# Patient Record
Sex: Female | Born: 1940 | Race: White | Hispanic: No | Smoking: Never smoker
Health system: Southern US, Community
[De-identification: ages and names within clinical notes are randomized; demographics above are authoritative.]

## PROBLEM LIST (undated history)

## (undated) DIAGNOSIS — M1990 Unspecified osteoarthritis, unspecified site: Secondary | ICD-10-CM

## (undated) DIAGNOSIS — E559 Vitamin D deficiency, unspecified: Secondary | ICD-10-CM

## (undated) DIAGNOSIS — E039 Hypothyroidism, unspecified: Secondary | ICD-10-CM

## (undated) DIAGNOSIS — E43 Unspecified severe protein-calorie malnutrition: Secondary | ICD-10-CM

## (undated) DIAGNOSIS — I10 Essential (primary) hypertension: Secondary | ICD-10-CM

## (undated) DIAGNOSIS — I499 Cardiac arrhythmia, unspecified: Secondary | ICD-10-CM

## (undated) DIAGNOSIS — Z950 Presence of cardiac pacemaker: Secondary | ICD-10-CM

## (undated) DIAGNOSIS — E876 Hypokalemia: Secondary | ICD-10-CM

## (undated) DIAGNOSIS — K589 Irritable bowel syndrome without diarrhea: Secondary | ICD-10-CM

## (undated) HISTORY — PX: ABDOMINAL HYSTERECTOMY: SHX81

## (undated) HISTORY — PX: OTHER SURGICAL HISTORY: SHX169

## (undated) HISTORY — PX: TOTAL KNEE ARTHROPLASTY: SHX125

## (undated) HISTORY — PX: INSERT / REPLACE / REMOVE PACEMAKER: SUR710

## (undated) HISTORY — PX: FRACTURE SURGERY: SHX138

---

## 1998-09-30 ENCOUNTER — Ambulatory Visit (HOSPITAL_COMMUNITY): Admission: RE | Admit: 1998-09-30 | Discharge: 1998-09-30 | Payer: Self-pay | Admitting: Gastroenterology

## 1999-08-28 ENCOUNTER — Other Ambulatory Visit: Admission: RE | Admit: 1999-08-28 | Discharge: 1999-08-28 | Payer: Self-pay | Admitting: Gynecology

## 2000-03-28 ENCOUNTER — Encounter: Payer: Self-pay | Admitting: Family Medicine

## 2000-03-28 ENCOUNTER — Ambulatory Visit (HOSPITAL_COMMUNITY): Admission: RE | Admit: 2000-03-28 | Discharge: 2000-03-28 | Payer: Self-pay | Admitting: Family Medicine

## 2000-07-09 ENCOUNTER — Ambulatory Visit (HOSPITAL_COMMUNITY): Admission: RE | Admit: 2000-07-09 | Discharge: 2000-07-09 | Payer: Self-pay | Admitting: Family Medicine

## 2000-07-09 ENCOUNTER — Encounter: Payer: Self-pay | Admitting: Family Medicine

## 2000-07-23 ENCOUNTER — Ambulatory Visit (HOSPITAL_COMMUNITY): Admission: RE | Admit: 2000-07-23 | Discharge: 2000-07-23 | Payer: Self-pay | Admitting: Family Medicine

## 2000-07-23 ENCOUNTER — Encounter: Payer: Self-pay | Admitting: Family Medicine

## 2000-08-09 ENCOUNTER — Ambulatory Visit (HOSPITAL_COMMUNITY): Admission: RE | Admit: 2000-08-09 | Discharge: 2000-08-09 | Payer: Self-pay | Admitting: Family Medicine

## 2000-08-09 ENCOUNTER — Encounter: Payer: Self-pay | Admitting: Family Medicine

## 2001-01-10 ENCOUNTER — Other Ambulatory Visit: Admission: RE | Admit: 2001-01-10 | Discharge: 2001-01-10 | Payer: Self-pay | Admitting: Gynecology

## 2001-02-27 ENCOUNTER — Encounter: Payer: Self-pay | Admitting: Family Medicine

## 2001-02-27 ENCOUNTER — Encounter: Admission: RE | Admit: 2001-02-27 | Discharge: 2001-02-27 | Payer: Self-pay | Admitting: Family Medicine

## 2001-05-02 ENCOUNTER — Encounter: Payer: Self-pay | Admitting: Gynecology

## 2001-05-02 ENCOUNTER — Encounter: Admission: RE | Admit: 2001-05-02 | Discharge: 2001-05-02 | Payer: Self-pay | Admitting: Gynecology

## 2002-02-24 ENCOUNTER — Other Ambulatory Visit: Admission: RE | Admit: 2002-02-24 | Discharge: 2002-02-24 | Payer: Self-pay | Admitting: Gynecology

## 2002-04-09 ENCOUNTER — Encounter: Admission: RE | Admit: 2002-04-09 | Discharge: 2002-04-09 | Payer: Self-pay | Admitting: Gynecology

## 2002-04-09 ENCOUNTER — Encounter: Payer: Self-pay | Admitting: Gynecology

## 2003-03-11 ENCOUNTER — Other Ambulatory Visit: Admission: RE | Admit: 2003-03-11 | Discharge: 2003-03-11 | Payer: Self-pay | Admitting: Gynecology

## 2003-03-16 ENCOUNTER — Encounter: Payer: Self-pay | Admitting: Gynecology

## 2003-03-16 ENCOUNTER — Encounter: Admission: RE | Admit: 2003-03-16 | Discharge: 2003-03-16 | Payer: Self-pay | Admitting: Gynecology

## 2004-05-22 ENCOUNTER — Other Ambulatory Visit: Admission: RE | Admit: 2004-05-22 | Discharge: 2004-05-22 | Payer: Self-pay | Admitting: Gynecology

## 2004-05-30 ENCOUNTER — Encounter: Admission: RE | Admit: 2004-05-30 | Discharge: 2004-05-30 | Payer: Self-pay | Admitting: Gynecology

## 2005-08-22 ENCOUNTER — Other Ambulatory Visit: Admission: RE | Admit: 2005-08-22 | Discharge: 2005-08-22 | Payer: Self-pay | Admitting: Gynecology

## 2005-09-04 ENCOUNTER — Encounter: Admission: RE | Admit: 2005-09-04 | Discharge: 2005-09-04 | Payer: Self-pay | Admitting: Gynecology

## 2006-08-27 ENCOUNTER — Other Ambulatory Visit: Admission: RE | Admit: 2006-08-27 | Discharge: 2006-08-27 | Payer: Self-pay | Admitting: Gynecology

## 2006-09-10 ENCOUNTER — Encounter: Admission: RE | Admit: 2006-09-10 | Discharge: 2006-09-10 | Payer: Self-pay | Admitting: Gynecology

## 2006-10-01 ENCOUNTER — Encounter: Admission: RE | Admit: 2006-10-01 | Discharge: 2006-10-01 | Payer: Self-pay | Admitting: Gynecology

## 2015-04-29 ENCOUNTER — Emergency Department (HOSPITAL_COMMUNITY): Payer: No Typology Code available for payment source

## 2015-04-29 ENCOUNTER — Inpatient Hospital Stay (HOSPITAL_COMMUNITY): Payer: No Typology Code available for payment source | Admitting: Certified Registered Nurse Anesthetist

## 2015-04-29 ENCOUNTER — Other Ambulatory Visit: Payer: Self-pay

## 2015-04-29 ENCOUNTER — Encounter (HOSPITAL_COMMUNITY): Admission: EM | Disposition: A | Discharge status: Rehabilitation Facility | Payer: Self-pay | Source: Home / Self Care

## 2015-04-29 ENCOUNTER — Inpatient Hospital Stay (HOSPITAL_COMMUNITY): Payer: No Typology Code available for payment source

## 2015-04-29 ENCOUNTER — Inpatient Hospital Stay (HOSPITAL_COMMUNITY)
Admission: EM | Admit: 2015-04-29 | Discharge: 2015-05-06 | DRG: 480 | Disposition: A | Discharge status: Rehabilitation Facility | Payer: No Typology Code available for payment source | Attending: General Surgery | Admitting: General Surgery

## 2015-04-29 ENCOUNTER — Encounter (HOSPITAL_COMMUNITY): Payer: Self-pay | Admitting: Cardiology

## 2015-04-29 DIAGNOSIS — I442 Atrioventricular block, complete: Secondary | ICD-10-CM | POA: Diagnosis not present

## 2015-04-29 DIAGNOSIS — E559 Vitamin D deficiency, unspecified: Secondary | ICD-10-CM | POA: Diagnosis present

## 2015-04-29 DIAGNOSIS — D62 Acute posthemorrhagic anemia: Secondary | ICD-10-CM | POA: Diagnosis present

## 2015-04-29 DIAGNOSIS — Z91040 Latex allergy status: Secondary | ICD-10-CM

## 2015-04-29 DIAGNOSIS — I440 Atrioventricular block, first degree: Secondary | ICD-10-CM | POA: Diagnosis not present

## 2015-04-29 DIAGNOSIS — M9711XS 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, sequela: Secondary | ICD-10-CM | POA: Diagnosis not present

## 2015-04-29 DIAGNOSIS — R58 Hemorrhage, not elsewhere classified: Secondary | ICD-10-CM

## 2015-04-29 DIAGNOSIS — S2243XA Multiple fractures of ribs, bilateral, initial encounter for closed fracture: Secondary | ICD-10-CM | POA: Diagnosis present

## 2015-04-29 DIAGNOSIS — Z888 Allergy status to other drugs, medicaments and biological substances status: Secondary | ICD-10-CM

## 2015-04-29 DIAGNOSIS — R079 Chest pain, unspecified: Secondary | ICD-10-CM | POA: Diagnosis not present

## 2015-04-29 DIAGNOSIS — M21379 Foot drop, unspecified foot: Secondary | ICD-10-CM | POA: Diagnosis present

## 2015-04-29 DIAGNOSIS — N390 Urinary tract infection, site not specified: Secondary | ICD-10-CM | POA: Diagnosis not present

## 2015-04-29 DIAGNOSIS — R571 Hypovolemic shock: Secondary | ICD-10-CM | POA: Diagnosis present

## 2015-04-29 DIAGNOSIS — R001 Bradycardia, unspecified: Secondary | ICD-10-CM | POA: Diagnosis not present

## 2015-04-29 DIAGNOSIS — J939 Pneumothorax, unspecified: Secondary | ICD-10-CM | POA: Diagnosis not present

## 2015-04-29 DIAGNOSIS — R52 Pain, unspecified: Secondary | ICD-10-CM | POA: Diagnosis not present

## 2015-04-29 DIAGNOSIS — S2220XS Unspecified fracture of sternum, sequela: Secondary | ICD-10-CM | POA: Diagnosis not present

## 2015-04-29 DIAGNOSIS — E039 Hypothyroidism, unspecified: Secondary | ICD-10-CM | POA: Diagnosis present

## 2015-04-29 DIAGNOSIS — I10 Essential (primary) hypertension: Secondary | ICD-10-CM | POA: Diagnosis present

## 2015-04-29 DIAGNOSIS — J9601 Acute respiratory failure with hypoxia: Secondary | ICD-10-CM | POA: Diagnosis not present

## 2015-04-29 DIAGNOSIS — R9431 Abnormal electrocardiogram [ECG] [EKG]: Secondary | ICD-10-CM | POA: Diagnosis present

## 2015-04-29 DIAGNOSIS — S72401A Unspecified fracture of lower end of right femur, initial encounter for closed fracture: Secondary | ICD-10-CM | POA: Diagnosis present

## 2015-04-29 DIAGNOSIS — Z419 Encounter for procedure for purposes other than remedying health state, unspecified: Secondary | ICD-10-CM

## 2015-04-29 DIAGNOSIS — R778 Other specified abnormalities of plasma proteins: Secondary | ICD-10-CM | POA: Diagnosis present

## 2015-04-29 DIAGNOSIS — S36112A Contusion of liver, initial encounter: Secondary | ICD-10-CM | POA: Diagnosis present

## 2015-04-29 DIAGNOSIS — S270XXS Traumatic pneumothorax, sequela: Secondary | ICD-10-CM | POA: Diagnosis not present

## 2015-04-29 DIAGNOSIS — R0902 Hypoxemia: Secondary | ICD-10-CM | POA: Diagnosis not present

## 2015-04-29 DIAGNOSIS — S2220XA Unspecified fracture of sternum, initial encounter for closed fracture: Secondary | ICD-10-CM | POA: Diagnosis present

## 2015-04-29 DIAGNOSIS — I495 Sick sinus syndrome: Secondary | ICD-10-CM | POA: Diagnosis not present

## 2015-04-29 DIAGNOSIS — S2243XS Multiple fractures of ribs, bilateral, sequela: Secondary | ICD-10-CM | POA: Diagnosis not present

## 2015-04-29 DIAGNOSIS — Z938 Other artificial opening status: Secondary | ICD-10-CM | POA: Diagnosis not present

## 2015-04-29 DIAGNOSIS — Z91018 Allergy to other foods: Secondary | ICD-10-CM | POA: Diagnosis not present

## 2015-04-29 DIAGNOSIS — Z885 Allergy status to narcotic agent status: Secondary | ICD-10-CM | POA: Diagnosis not present

## 2015-04-29 DIAGNOSIS — S72309A Unspecified fracture of shaft of unspecified femur, initial encounter for closed fracture: Secondary | ICD-10-CM

## 2015-04-29 DIAGNOSIS — M9711XA 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, initial encounter: Secondary | ICD-10-CM | POA: Diagnosis present

## 2015-04-29 DIAGNOSIS — R7989 Other specified abnormal findings of blood chemistry: Secondary | ICD-10-CM | POA: Diagnosis not present

## 2015-04-29 DIAGNOSIS — J9811 Atelectasis: Secondary | ICD-10-CM | POA: Diagnosis not present

## 2015-04-29 DIAGNOSIS — R55 Syncope and collapse: Secondary | ICD-10-CM

## 2015-04-29 DIAGNOSIS — M810 Age-related osteoporosis without current pathological fracture: Secondary | ICD-10-CM | POA: Diagnosis present

## 2015-04-29 DIAGNOSIS — S270XXA Traumatic pneumothorax, initial encounter: Secondary | ICD-10-CM | POA: Diagnosis present

## 2015-04-29 DIAGNOSIS — Z9889 Other specified postprocedural states: Secondary | ICD-10-CM

## 2015-04-29 DIAGNOSIS — E876 Hypokalemia: Secondary | ICD-10-CM | POA: Diagnosis present

## 2015-04-29 DIAGNOSIS — I4581 Long QT syndrome: Secondary | ICD-10-CM | POA: Diagnosis not present

## 2015-04-29 DIAGNOSIS — R339 Retention of urine, unspecified: Secondary | ICD-10-CM | POA: Diagnosis not present

## 2015-04-29 DIAGNOSIS — J9600 Acute respiratory failure, unspecified whether with hypoxia or hypercapnia: Secondary | ICD-10-CM | POA: Diagnosis not present

## 2015-04-29 DIAGNOSIS — S72491A Other fracture of lower end of right femur, initial encounter for closed fracture: Secondary | ICD-10-CM | POA: Diagnosis not present

## 2015-04-29 DIAGNOSIS — R0782 Intercostal pain: Secondary | ICD-10-CM | POA: Diagnosis not present

## 2015-04-29 DIAGNOSIS — S272XXA Traumatic hemopneumothorax, initial encounter: Secondary | ICD-10-CM | POA: Diagnosis present

## 2015-04-29 DIAGNOSIS — T148XXA Other injury of unspecified body region, initial encounter: Secondary | ICD-10-CM

## 2015-04-29 HISTORY — DX: Hypothyroidism, unspecified: E03.9

## 2015-04-29 HISTORY — DX: Unspecified osteoarthritis, unspecified site: M19.90

## 2015-04-29 HISTORY — DX: Irritable bowel syndrome, unspecified: K58.9

## 2015-04-29 HISTORY — DX: Vitamin D deficiency, unspecified: E55.9

## 2015-04-29 HISTORY — PX: ORIF FEMUR FRACTURE: SHX2119

## 2015-04-29 HISTORY — DX: Essential (primary) hypertension: I10

## 2015-04-29 LAB — PREPARE FRESH FROZEN PLASMA
UNIT DIVISION: 0
Unit division: 0

## 2015-04-29 LAB — POCT I-STAT 3, ART BLOOD GAS (G3+)
Acid-base deficit: 5 mmol/L — ABNORMAL HIGH (ref 0.0–2.0)
BICARBONATE: 20.6 meq/L (ref 20.0–24.0)
O2 SAT: 99 %
TCO2: 22 mmol/L (ref 0–100)
pCO2 arterial: 40.9 mmHg (ref 35.0–45.0)
pH, Arterial: 7.309 — ABNORMAL LOW (ref 7.350–7.450)
pO2, Arterial: 153 mmHg — ABNORMAL HIGH (ref 80.0–100.0)

## 2015-04-29 LAB — I-STAT CHEM 8, ED
BUN: 25 mg/dL — ABNORMAL HIGH (ref 6–20)
BUN: 27 mg/dL — AB (ref 6–20)
CALCIUM ION: 1.02 mmol/L — AB (ref 1.13–1.30)
CREATININE: 0.9 mg/dL (ref 0.44–1.00)
Calcium, Ion: 1.08 mmol/L — ABNORMAL LOW (ref 1.13–1.30)
Chloride: 108 mmol/L (ref 101–111)
Chloride: 112 mmol/L — ABNORMAL HIGH (ref 101–111)
Creatinine, Ser: 1.2 mg/dL — ABNORMAL HIGH (ref 0.44–1.00)
GLUCOSE: 154 mg/dL — AB (ref 65–99)
GLUCOSE: 113 mg/dL — AB (ref 65–99)
HCT: 30 % — ABNORMAL LOW (ref 36.0–46.0)
HCT: 32 % — ABNORMAL LOW (ref 36.0–46.0)
HEMOGLOBIN: 10.2 g/dL — AB (ref 12.0–15.0)
Hemoglobin: 10.9 g/dL — ABNORMAL LOW (ref 12.0–15.0)
POTASSIUM: 3.2 mmol/L — AB (ref 3.5–5.1)
Potassium: 3.3 mmol/L — ABNORMAL LOW (ref 3.5–5.1)
Sodium: 144 mmol/L (ref 135–145)
Sodium: 142 mmol/L (ref 135–145)
TCO2: 21 mmol/L (ref 0–100)
TCO2: 20 mmol/L (ref 0–100)

## 2015-04-29 LAB — COMPREHENSIVE METABOLIC PANEL
ALT: 278 U/L — ABNORMAL HIGH (ref 14–54)
ANION GAP: 6 (ref 5–15)
AST: 335 U/L — ABNORMAL HIGH (ref 15–41)
Albumin: 3 g/dL — ABNORMAL LOW (ref 3.5–5.0)
Alkaline Phosphatase: 71 U/L (ref 38–126)
BILIRUBIN TOTAL: 0.9 mg/dL (ref 0.3–1.2)
BUN: 24 mg/dL — AB (ref 6–20)
CO2: 22 mmol/L (ref 22–32)
Calcium: 7.9 mg/dL — ABNORMAL LOW (ref 8.9–10.3)
Chloride: 114 mmol/L — ABNORMAL HIGH (ref 101–111)
Creatinine, Ser: 1.18 mg/dL — ABNORMAL HIGH (ref 0.44–1.00)
GFR calc Af Amer: 51 mL/min — ABNORMAL LOW (ref >60)
GFR, EST NON AFRICAN AMERICAN: 44 mL/min — AB (ref >60)
Glucose, Bld: 162 mg/dL — ABNORMAL HIGH (ref 65–99)
POTASSIUM: 3.2 mmol/L — AB (ref 3.5–5.1)
Sodium: 142 mmol/L (ref 135–145)
TOTAL PROTEIN: 4.9 g/dL — AB (ref 6.5–8.1)

## 2015-04-29 LAB — CBC
HEMATOCRIT: 29.6 % — AB (ref 36.0–46.0)
HEMOGLOBIN: 9.5 g/dL — AB (ref 12.0–15.0)
MCH: 27.9 pg (ref 26.0–34.0)
MCHC: 32.1 g/dL (ref 30.0–36.0)
MCV: 86.8 fL (ref 78.0–100.0)
Platelets: 192 10*3/uL (ref 150–400)
RBC: 3.41 MIL/uL — ABNORMAL LOW (ref 3.87–5.11)
RDW: 14.9 % (ref 11.5–15.5)
WBC: 10.6 10*3/uL — AB (ref 4.0–10.5)

## 2015-04-29 LAB — TRIGLYCERIDES: Triglycerides: 22 mg/dL (ref <150)

## 2015-04-29 LAB — BLOOD PRODUCT ORDER (VERBAL) VERIFICATION

## 2015-04-29 LAB — MRSA PCR SCREENING: MRSA by PCR: NEGATIVE

## 2015-04-29 LAB — I-STAT TROPONIN, ED: TROPONIN I, POC: 0.66 ng/mL — AB (ref 0.00–0.08)

## 2015-04-29 LAB — PROTIME-INR
INR: 1.22 (ref 0.00–1.49)
PROTHROMBIN TIME: 15.6 s — AB (ref 11.6–15.2)

## 2015-04-29 LAB — ETHANOL

## 2015-04-29 LAB — I-STAT CG4 LACTIC ACID, ED: LACTIC ACID, VENOUS: 2.04 mmol/L — AB (ref 0.5–2.0)

## 2015-04-29 LAB — ABO/RH: ABO/RH(D): A POS

## 2015-04-29 LAB — CDS SEROLOGY

## 2015-04-29 LAB — LIPASE, BLOOD: Lipase: 39 U/L (ref 11–51)

## 2015-04-29 SURGERY — OPEN REDUCTION INTERNAL FIXATION (ORIF) DISTAL FEMUR FRACTURE
Anesthesia: General | Laterality: Right

## 2015-04-29 MED ORDER — MIDAZOLAM HCL 5 MG/5ML IJ SOLN
INTRAMUSCULAR | Status: DC | PRN
  Administered 2015-04-29: 1 mg via INTRAVENOUS
  Administered 2015-04-29 (×2): .5 mg via INTRAVENOUS

## 2015-04-29 MED ORDER — PHENYLEPHRINE 40 MCG/ML (10ML) SYRINGE FOR IV PUSH (FOR BLOOD PRESSURE SUPPORT)
PREFILLED_SYRINGE | INTRAVENOUS | Status: AC
  Filled 2015-04-29: qty 30

## 2015-04-29 MED ORDER — PHENYLEPHRINE HCL 10 MG/ML IJ SOLN
10.0000 mg | INTRAVENOUS | Status: DC | PRN
  Administered 2015-04-29: 50 ug/min via INTRAVENOUS

## 2015-04-29 MED ORDER — OXYCODONE HCL 5 MG PO TABS: 5.0000 mg | ORAL_TABLET | Freq: Once | ORAL | Status: DC | PRN

## 2015-04-29 MED ORDER — PANTOPRAZOLE SODIUM 40 MG PO TBEC
40.0000 mg | DELAYED_RELEASE_TABLET | Freq: Every day | ORAL | Status: DC
  Administered 2015-04-30 – 2015-05-05 (×6): 40 mg via ORAL
  Filled 2015-04-29 (×6): qty 1

## 2015-04-29 MED ORDER — DIPHENHYDRAMINE HCL 50 MG/ML IJ SOLN: 12.5000 mg | Freq: Four times a day (QID) | INTRAMUSCULAR | Status: DC | PRN

## 2015-04-29 MED ORDER — ETOMIDATE 2 MG/ML IV SOLN
INTRAVENOUS | Status: AC
  Filled 2015-04-29: qty 20

## 2015-04-29 MED ORDER — OXYCODONE HCL 5 MG/5ML PO SOLN: 5.0000 mg | Freq: Once | ORAL | Status: DC | PRN

## 2015-04-29 MED ORDER — PANTOPRAZOLE SODIUM 40 MG IV SOLR
40.0000 mg | Freq: Every day | INTRAVENOUS | Status: DC
  Administered 2015-04-29: 40 mg via INTRAVENOUS
  Filled 2015-04-29 (×5): qty 40

## 2015-04-29 MED ORDER — 0.9 % SODIUM CHLORIDE (POUR BTL) OPTIME
TOPICAL | Status: DC | PRN
  Administered 2015-04-29: 1000 mL

## 2015-04-29 MED ORDER — LIDOCAINE HCL (PF) 1 % IJ SOLN
INTRAMUSCULAR | Status: AC
  Filled 2015-04-29: qty 15

## 2015-04-29 MED ORDER — EPINEPHRINE HCL 0.1 MG/ML IJ SOSY
PREFILLED_SYRINGE | INTRAMUSCULAR | Status: DC | PRN
  Administered 2015-04-29: 0.1 mg via INTRAVENOUS

## 2015-04-29 MED ORDER — ROPIVACAINE HCL 2 MG/ML IJ SOLN
INTRAMUSCULAR | Status: DC | PRN
  Administered 2015-04-29: 3 mL/h via EPIDURAL

## 2015-04-29 MED ORDER — PROPOFOL 500 MG/50ML IV EMUL
INTRAVENOUS | Status: DC | PRN
  Administered 2015-04-29: 50 ug/kg/min via INTRAVENOUS

## 2015-04-29 MED ORDER — SODIUM CHLORIDE 0.9 % IJ SOLN
INTRAMUSCULAR | Status: AC
  Filled 2015-04-29: qty 20

## 2015-04-29 MED ORDER — ANTISEPTIC ORAL RINSE SOLUTION (CORINZ)
7.0000 mL | OROMUCOSAL | Status: DC
  Administered 2015-04-29 – 2015-04-30 (×8): 7 mL via OROMUCOSAL

## 2015-04-29 MED ORDER — SODIUM CHLORIDE 0.9 % IV BOLUS (SEPSIS)
2000.0000 mL | Freq: Once | INTRAVENOUS | Status: AC
  Administered 2015-04-29: 2000 mL via INTRAVENOUS

## 2015-04-29 MED ORDER — ROPIVACAINE HCL 2 MG/ML IJ SOLN
3.0000 mL/h | INTRAMUSCULAR | Status: DC
  Administered 2015-04-29: 3 mL/h via EPIDURAL
  Filled 2015-04-29: qty 200

## 2015-04-29 MED ORDER — POTASSIUM CHLORIDE IN NACL 40-0.9 MEQ/L-% IV SOLN
INTRAVENOUS | Status: DC
  Administered 2015-04-29 – 2015-05-01 (×3): 75 mL/h via INTRAVENOUS
  Administered 2015-05-01: 50 mL/h via INTRAVENOUS
  Filled 2015-04-29 (×8): qty 1000

## 2015-04-29 MED ORDER — ROPIVACAINE HCL 2 MG/ML IJ SOLN
2.0000 mL/h | INTRAMUSCULAR | Status: DC
  Filled 2015-04-29: qty 200

## 2015-04-29 MED ORDER — MIDAZOLAM HCL 2 MG/2ML IJ SOLN
INTRAMUSCULAR | Status: AC
  Filled 2015-04-29: qty 4

## 2015-04-29 MED ORDER — ROCURONIUM BROMIDE 50 MG/5ML IV SOLN
INTRAVENOUS | Status: AC
  Filled 2015-04-29: qty 2

## 2015-04-29 MED ORDER — LACTATED RINGERS IV SOLN
INTRAVENOUS | Status: DC
  Administered 2015-04-29 (×2): via INTRAVENOUS

## 2015-04-29 MED ORDER — SUCCINYLCHOLINE CHLORIDE 20 MG/ML IJ SOLN
INTRAMUSCULAR | Status: DC | PRN
  Administered 2015-04-29: 120 mg via INTRAVENOUS

## 2015-04-29 MED ORDER — PHENYLEPHRINE HCL 10 MG/ML IJ SOLN
INTRAMUSCULAR | Status: DC | PRN
  Administered 2015-04-29 (×3): 240 ug via INTRAVENOUS
  Administered 2015-04-29 (×2): 120 ug via INTRAVENOUS
  Administered 2015-04-29: 40 ug via INTRAVENOUS
  Administered 2015-04-29: 240 ug via INTRAVENOUS

## 2015-04-29 MED ORDER — EPHEDRINE SULFATE 50 MG/ML IJ SOLN
INTRAMUSCULAR | Status: AC
  Filled 2015-04-29: qty 2

## 2015-04-29 MED ORDER — PROPOFOL 1000 MG/100ML IV EMUL
5.0000 ug/kg/min | INTRAVENOUS | Status: DC
  Administered 2015-04-29: 15 ug/kg/min via INTRAVENOUS
  Administered 2015-04-29: 30 ug/kg/min via INTRAVENOUS
  Filled 2015-04-29: qty 100

## 2015-04-29 MED ORDER — MORPHINE SULFATE 2 MG/ML IV SOLN: INTRAVENOUS | Status: DC

## 2015-04-29 MED ORDER — LIDOCAINE HCL (CARDIAC) 20 MG/ML IV SOLN
INTRAVENOUS | Status: AC
  Filled 2015-04-29: qty 15

## 2015-04-29 MED ORDER — DEXAMETHASONE SODIUM PHOSPHATE 4 MG/ML IJ SOLN
INTRAMUSCULAR | Status: AC
  Filled 2015-04-29: qty 2

## 2015-04-29 MED ORDER — ONDANSETRON HCL 4 MG/2ML IJ SOLN
4.0000 mg | Freq: Once | INTRAMUSCULAR | Status: AC
  Administered 2015-04-29: 4 mg via INTRAVENOUS

## 2015-04-29 MED ORDER — NEOSTIGMINE METHYLSULFATE 10 MG/10ML IV SOLN
INTRAVENOUS | Status: AC
  Filled 2015-04-29: qty 1

## 2015-04-29 MED ORDER — HYDROMORPHONE HCL 1 MG/ML IJ SOLN: 0.2500 mg | INTRAMUSCULAR | Status: DC | PRN

## 2015-04-29 MED ORDER — ACETAMINOPHEN 160 MG/5ML PO SOLN
325.0000 mg | ORAL | Status: DC | PRN
  Filled 2015-04-29: qty 20.3

## 2015-04-29 MED ORDER — SODIUM CHLORIDE 0.9 % IJ SOLN: 9.0000 mL | INTRAMUSCULAR | Status: DC | PRN

## 2015-04-29 MED ORDER — EPHEDRINE SULFATE 50 MG/ML IJ SOLN
INTRAMUSCULAR | Status: DC | PRN
  Administered 2015-04-29 (×3): 10 mg via INTRAVENOUS
  Administered 2015-04-29 (×2): 5 mg via INTRAVENOUS
  Administered 2015-04-29: 10 mg via INTRAVENOUS

## 2015-04-29 MED ORDER — ONDANSETRON HCL 4 MG/2ML IJ SOLN
4.0000 mg | Freq: Four times a day (QID) | INTRAMUSCULAR | Status: DC | PRN
  Administered 2015-05-02 – 2015-05-06 (×5): 4 mg via INTRAVENOUS
  Filled 2015-04-29 (×5): qty 2

## 2015-04-29 MED ORDER — LACTATED RINGERS IV SOLN: INTRAVENOUS | Status: DC | PRN

## 2015-04-29 MED ORDER — ENOXAPARIN SODIUM 30 MG/0.3ML ~~LOC~~ SOLN
30.0000 mg | Freq: Two times a day (BID) | SUBCUTANEOUS | Status: DC
Start: 2015-04-29 — End: 2015-04-30
  Administered 2015-04-30 (×2): 30 mg via SUBCUTANEOUS
  Filled 2015-04-29 (×2): qty 0.3

## 2015-04-29 MED ORDER — DIPHENHYDRAMINE HCL 12.5 MG/5ML PO ELIX: 12.5000 mg | ORAL_SOLUTION | Freq: Four times a day (QID) | ORAL | Status: DC | PRN

## 2015-04-29 MED ORDER — FENTANYL CITRATE (PF) 100 MCG/2ML IJ SOLN
INTRAMUSCULAR | Status: DC | PRN
Start: 2015-04-29 — End: 2015-04-29
  Administered 2015-04-29: 50 ug via INTRAVENOUS

## 2015-04-29 MED ORDER — PROMETHAZINE HCL 25 MG/ML IJ SOLN
25.0000 mg | INTRAMUSCULAR | Status: AC
  Administered 2015-04-29: 12.5 mg via INTRAVENOUS
  Filled 2015-04-29: qty 1

## 2015-04-29 MED ORDER — SUCCINYLCHOLINE CHLORIDE 20 MG/ML IJ SOLN
INTRAMUSCULAR | Status: AC
  Filled 2015-04-29: qty 1

## 2015-04-29 MED ORDER — PROPOFOL 10 MG/ML IV BOLUS
INTRAVENOUS | Status: DC | PRN
  Administered 2015-04-29: 30 mg via INTRAVENOUS
  Administered 2015-04-29: 10 mg via INTRAVENOUS

## 2015-04-29 MED ORDER — FENTANYL CITRATE (PF) 250 MCG/5ML IJ SOLN
INTRAMUSCULAR | Status: AC
  Filled 2015-04-29: qty 5

## 2015-04-29 MED ORDER — ALBUMIN HUMAN 5 % IV SOLN
INTRAVENOUS | Status: AC
  Filled 2015-04-29: qty 250

## 2015-04-29 MED ORDER — CHLORHEXIDINE GLUCONATE 0.12% ORAL RINSE (MEDLINE KIT)
15.0000 mL | Freq: Two times a day (BID) | OROMUCOSAL | Status: DC
  Administered 2015-04-29 – 2015-05-01 (×3): 15 mL via OROMUCOSAL

## 2015-04-29 MED ORDER — IOHEXOL 300 MG/ML  SOLN
100.0000 mL | Freq: Once | INTRAMUSCULAR | Status: AC | PRN
  Administered 2015-04-29: 100 mL via INTRAVENOUS

## 2015-04-29 MED ORDER — BUPIVACAINE-EPINEPHRINE (PF) 0.25% -1:200000 IJ SOLN
INTRAMUSCULAR | Status: AC
  Filled 2015-04-29: qty 30

## 2015-04-29 MED ORDER — PHENYLEPHRINE 40 MCG/ML (10ML) SYRINGE FOR IV PUSH (FOR BLOOD PRESSURE SUPPORT)
PREFILLED_SYRINGE | INTRAVENOUS | Status: AC
  Filled 2015-04-29: qty 10

## 2015-04-29 MED ORDER — DEXAMETHASONE SODIUM PHOSPHATE 4 MG/ML IJ SOLN
INTRAMUSCULAR | Status: DC | PRN
  Administered 2015-04-29: 4 mg via INTRAVENOUS

## 2015-04-29 MED ORDER — ONDANSETRON HCL 4 MG/2ML IJ SOLN
INTRAMUSCULAR | Status: AC
  Filled 2015-04-29: qty 8

## 2015-04-29 MED ORDER — NALOXONE HCL 0.4 MG/ML IJ SOLN: 0.4000 mg | INTRAMUSCULAR | Status: DC | PRN

## 2015-04-29 MED ORDER — LIDOCAINE HCL (CARDIAC) 20 MG/ML IV SOLN
INTRAVENOUS | Status: DC | PRN
  Administered 2015-04-29: 20 mg via INTRAVENOUS

## 2015-04-29 MED ORDER — PROPOFOL 10 MG/ML IV BOLUS
INTRAVENOUS | Status: AC
  Filled 2015-04-29: qty 20

## 2015-04-29 MED ORDER — DOCUSATE SODIUM 100 MG PO CAPS
100.0000 mg | ORAL_CAPSULE | Freq: Two times a day (BID) | ORAL | Status: DC
  Administered 2015-04-30 – 2015-05-01 (×2): 100 mg via ORAL
  Filled 2015-04-29 (×4): qty 1

## 2015-04-29 MED ORDER — GLYCOPYRROLATE 0.2 MG/ML IJ SOLN
INTRAMUSCULAR | Status: AC
  Filled 2015-04-29: qty 1

## 2015-04-29 MED ORDER — CEFAZOLIN SODIUM-DEXTROSE 2-3 GM-% IV SOLR
INTRAVENOUS | Status: DC | PRN
  Administered 2015-04-29: 2 g via INTRAVENOUS

## 2015-04-29 MED ORDER — MORPHINE SULFATE (PF) 2 MG/ML IV SOLN
INTRAVENOUS | Status: AC
  Administered 2015-04-29: 2 mg via INTRAVENOUS
  Filled 2015-04-29: qty 1

## 2015-04-29 MED ORDER — ONDANSETRON HCL 4 MG/2ML IJ SOLN
INTRAMUSCULAR | Status: DC | PRN
  Administered 2015-04-29: 4 mg via INTRAVENOUS

## 2015-04-29 MED ORDER — ACETAMINOPHEN 325 MG PO TABS: 325.0000 mg | ORAL_TABLET | ORAL | Status: DC | PRN

## 2015-04-29 MED ORDER — ONDANSETRON HCL 4 MG PO TABS: 4.0000 mg | ORAL_TABLET | Freq: Four times a day (QID) | ORAL | Status: DC | PRN

## 2015-04-29 MED ORDER — LIDOCAINE HCL (CARDIAC) 20 MG/ML IV SOLN
INTRAVENOUS | Status: AC
  Filled 2015-04-29: qty 5

## 2015-04-29 MED ORDER — ALBUMIN HUMAN 5 % IV SOLN
INTRAVENOUS | Status: DC | PRN
Start: 2015-04-29 — End: 2015-04-29
  Administered 2015-04-29 (×4): via INTRAVENOUS

## 2015-04-29 SURGICAL SUPPLY — 39 items
BIT DRILL 200X4XGRY 5XPIN (BIT) ×1 IMPLANT
BIT DRL 200X4XGRY 5XPIN (BIT) ×1
BLADE SURG ROTATE 9660 (MISCELLANEOUS) IMPLANT
BNDG GAUZE ELAST 4 BULKY (GAUZE/BANDAGES/DRESSINGS) ×2 IMPLANT
DRAPE C-ARM 42X72 X-RAY (DRAPES) ×2 IMPLANT
DRAPE C-ARMOR (DRAPES) ×2 IMPLANT
DRAPE IMP U-DRAPE 54X76 (DRAPES) ×4 IMPLANT
DRAPE ORTHO SPLIT 77X108 STRL (DRAPES) ×2
DRAPE SURG ORHT 6 SPLT 77X108 (DRAPES) ×2 IMPLANT
DRAPE U-SHAPE 47X51 STRL (DRAPES) ×2 IMPLANT
DRILL BIT 4.0MM (BIT) ×1
ELECT REM PT RETURN 9FT ADLT (ELECTROSURGICAL) ×2
ELECTRODE REM PT RTRN 9FT ADLT (ELECTROSURGICAL) ×1 IMPLANT
GAUZE XEROFORM 5X9 LF (GAUZE/BANDAGES/DRESSINGS) ×2 IMPLANT
GLOVE NEODERM STRL 7.5 LF PF (GLOVE) ×2 IMPLANT
GLOVE SURG NEODERM 7.5  LF PF (GLOVE) ×2
GOWN STRL REIN XL XLG (GOWN DISPOSABLE) ×2 IMPLANT
KIT BASIN OR (CUSTOM PROCEDURE TRAY) ×2 IMPLANT
KIT ROOM TURNOVER OR (KITS) ×2 IMPLANT
MANIFOLD NEPTUNE II (INSTRUMENTS) ×2 IMPLANT
NS IRRIG 1000ML POUR BTL (IV SOLUTION) ×2 IMPLANT
PACK GENERAL/GYN (CUSTOM PROCEDURE TRAY) ×2 IMPLANT
PACK UNIVERSAL I (CUSTOM PROCEDURE TRAY) ×2 IMPLANT
PAD ARMBOARD 7.5X6 YLW CONV (MISCELLANEOUS) ×2 IMPLANT
PIN APEX 5.0X200MM (PIN) ×4 IMPLANT
PIN APEX 5X180MM EXFIX (EXFIX) ×4 IMPLANT
PIN CLAMP 5H 30DEG POST (EXFIX) ×4 IMPLANT
ROD CARBON (EXFIX) ×2
ROD CNCT 250X11XNS LF HFMN (EXFIX) ×2 IMPLANT
ROD HOFFMANN3 CONNECT 11X350 (EXFIX) ×4 IMPLANT
ROD TO ROD COUPLING EXFIX (EXFIX) ×12 IMPLANT
STAPLER VISISTAT 35W (STAPLE) IMPLANT
SUT ETHILON 2 0 FS 18 (SUTURE) IMPLANT
SUT VIC AB 0 CT1 27 (SUTURE)
SUT VIC AB 0 CT1 27XBRD ANBCTR (SUTURE) IMPLANT
SUT VIC AB 2-0 CT1 36 (SUTURE) ×2 IMPLANT
TOWEL OR 17X24 6PK STRL BLUE (TOWEL DISPOSABLE) ×2 IMPLANT
TOWEL OR 17X26 10 PK STRL BLUE (TOWEL DISPOSABLE) ×4 IMPLANT
WATER STERILE IRR 1000ML POUR (IV SOLUTION) ×4 IMPLANT
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
## 2015-04-29 NOTE — Progress Notes (Signed)
Transported patient from PACU with no complications. RT will continue to monitor.

## 2015-04-29 NOTE — OR Nursing (Signed)
Second procedure added with Dr Tobias Alexander for a thoracic epidural. Met with son in the waiting room to obtain consent.

## 2015-04-29 NOTE — Progress Notes (Signed)
Orthopedic Tech Progress Note Patient Details:  Autumn Benjamin 1940-12-10 [id number]  Ortho Devices Type of Ortho Device: Knee Immobilizer Ortho Device/Splint Location: rle Ortho Device/Splint Interventions: Application   Autumn Benjamin 04/29/2015, 1:42 PM
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
## 2015-04-29 NOTE — Transfer of Care (Signed)
Immediate Anesthesia Transfer of Care Note  Patient: Autumn Benjamin  Procedure(s) Performed: Procedure(s): PLACEMENT OF EXTERNAL FIXATOR FOR DISTAL FEMUR FRACTURE (Right)  Patient Location: PACU  Anesthesia Type:General  Level of Consciousness: Patient remains intubated per anesthesia plan  Airway & Oxygen Therapy: Patient remains intubated per anesthesia plan and Patient placed on Ventilator (see vital sign flow sheet for setting)  Post-op Assessment: Report given to RN and Post -op Vital signs reviewed and stable  Post vital signs: Reviewed and stable  Last Vitals:  Filed Vitals:   04/29/15 1923  BP: 130/73  Pulse: 72  Temp:   Resp: 16    Complications: No apparent anesthesia complications

## 2015-04-29 NOTE — Anesthesia Preprocedure Evaluation (Signed)
Anesthesia Evaluation  Patient identified by MRN, date of birth, ID band Patient awake    Reviewed: Allergy & Precautions, NPO status , Patient's Chart, lab work & pertinent test results  History of Anesthesia Complications Negative for: history of anesthetic complications  Airway Mallampati: III  TM Distance: >3 FB Neck ROM: Full    Dental  (+) Teeth Intact   Pulmonary  Rib fractures with chest tube and short of breath   breath sounds clear to auscultation       Cardiovascular negative cardio ROS   Rhythm:Regular     Neuro/Psych negative neurological ROS  negative psych ROS   GI/Hepatic negative GI ROS, Neg liver ROS,   Endo/Other  Hypothyroidism   Renal/GU negative Renal ROS     Musculoskeletal  (+) Arthritis , Rheumatoid disorders,  Open right femur fracture   Abdominal   Peds  Hematology negative hematology ROS (+)   Anesthesia Other Findings   Reproductive/Obstetrics                             Anesthesia Physical Anesthesia Plan  ASA: III and emergent  Anesthesia Plan: General   Post-op Pain Management:    Induction: Intravenous, Rapid sequence and Cricoid pressure planned  Airway Management Planned: Oral ETT  Additional Equipment: Arterial line  Intra-op Plan:   Post-operative Plan: Extubation in OR and Possible Post-op intubation/ventilation  Informed Consent: I have reviewed the patients History and Physical, chart, labs and discussed the procedure including the risks, benefits and alternatives for the proposed anesthesia with the patient or authorized representative who has indicated his/her understanding and acceptance.   Dental advisory given  Plan Discussed with: CRNA and Surgeon  Anesthesia Plan Comments: (FNB once asleep for post op pain control. )        Anesthesia Quick Evaluation

## 2015-04-29 NOTE — ED Notes (Signed)
Pt back to room. Dr. Rayburn Ma at the bedside to place chest tube

## 2015-04-29 NOTE — ED Notes (Signed)
Autumn Benjamin at the bedside

## 2015-04-29 NOTE — ED Notes (Signed)
Xray at the bedside.

## 2015-04-29 NOTE — OR Nursing (Signed)
Spoke with charge nurse on unit to let them know likelihood of pt returning to unit intubated
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
## 2015-04-29 NOTE — Anesthesia Procedure Notes (Addendum)
Procedure Name: Intubation Date/Time: 04/29/2015 5:21 PM Performed by: Daiva Eves Pre-anesthesia Checklist: Patient identified, Timeout performed, Emergency Drugs available, Suction available and Patient being monitored Patient Re-evaluated:Patient Re-evaluated prior to inductionOxygen Delivery Method: Circle system utilized Preoxygenation: Pre-oxygenation with 100% oxygen Intubation Type: IV induction Ventilation: Mask ventilation without difficulty Laryngoscope Size: Mac and 3 Grade View: Grade I Tube type: Subglottic suction tube Tube size: 7.5 mm Number of attempts: 1 Airway Equipment and Method: Stylet Placement Confirmation: ETT inserted through vocal cords under direct vision,  breath sounds checked- equal and bilateral,  positive ETCO2 and CO2 detector Secured at: 22 cm Tube secured with: Tape Dental Injury: Teeth and Oropharynx as per pre-operative assessment    Epidural Patient location during procedure: OR Start time: 04/29/2015 6:51 PM End time: 04/29/2015 7:03 PM  Staffing Anesthesiologist: Heather Roberts Performed by: anesthesiologist   Preanesthetic Checklist Completed: patient identified, surgical consent, pre-op evaluation, timeout performed, IV checked, risks and benefits discussed, monitors and equipment checked and post-op pain management  Epidural Patient position: left lateral decubitus Prep: DuraPrep Patient monitoring: heart rate, cardiac monitor, continuous pulse ox and blood pressure Approach: midline Location: thoracic (1-12) Injection technique: LOR air  Needle:  Needle type: Tuohy  Needle gauge: 17 G Needle length: 9 cm Needle insertion depth: 5 cm Catheter type: closed end flexible Catheter size: 20 Guage Catheter at skin depth: 10 cm Test dose: negative  Assessment Events: blood not aspirated  Additional Notes Reason for block:post-op pain management
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
## 2015-04-29 NOTE — Anesthesia Postprocedure Evaluation (Signed)
Anesthesia Post Note  Patient: Autumn Benjamin  Procedure(s) Performed: Procedure(s) (LRB): PLACEMENT OF EXTERNAL FIXATOR FOR DISTAL FEMUR FRACTURE (Right)  Anesthesia type: General  Patient location: PACU  Post pain: Pain level controlled  Post assessment: Post-op Vital signs reviewed  Last Vitals:  Filed Vitals:   04/29/15 2053  BP: 136/74  Pulse: 66  Temp: 36.4 C  Resp: 14    Post vital signs: stable  Level of consciousness: Patient remains intubated per anesthesia plan  Complications: No apparent anesthesia complications
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
## 2015-04-29 NOTE — Op Note (Signed)
   Date of Surgery: 04/29/2015  INDICATIONS: Autumn Benjamin is a 74 y.o.-year-old female who sustained a distal femur periprosthetic fracture; she is brought to the operating room with plans of operative fixation of the fracture.  However shortly after induction of anesthesia she became profoundly hypotensive with systolic blood pressures in the 40s and 50s. She required multiple pressors. At that time we decided the best thing for her was not to perform formal fixation rather performed provisional fixation with external fixator device.  PREOPERATIVE DIAGNOSIS: right distal femur periprosthetic fracture   POSTOPERATIVE DIAGNOSIS: Same.  PROCEDURE: External fixation right distal femur fracture CPT 20690  SURGEON: N. Glee Arvin, M.D.  ANESTHESIA: general  IV FLUIDS AND URINE: See anesthesia.  ESTIMATED BLOOD LOSS: minimal mL.  IMPLANTS: [employer]  DRAINS: None.  COMPLICATIONS: None.  DESCRIPTION OF PROCEDURE: The patient was identified in the preoperative holding area.  The operative site was marked by the surgeon and confirmed by the patient.  He was brought back to the operating room.  Anesthesia was induced by the anesthesia team.  A well padded nonsterile tourniquet was placed. The operative extremity was prepped and draped in standard sterile fashion.  A timeout was performed.  Preoperative antibiotics were given.  The bony landmarks were palpated and the pin sites were marked on the skin.  Each Schanz pin was placed in the same fashion -- first drilling with the 3.5 mm drill while copiously irrigating, then hand placing the pin.  This was confirmed on x-ray on both views.  The ex-fix clamps were placed onto pins and the fracture was pulled into the proper alignment.  The clamps were completely tightened.   Final x-rays were taken in AP and lateral views to confirm the reduction and pin lengths. The wounds were cleaned and dried a final time and a sterile dressing consisting of Xeroform and  kerlix was placed.  The patient was then transferred back to the bed and left the operating room in stable condition.  All sponge and instrument counts were correct.  POSTOPERATIVE PLAN: Autumn Benjamin will remain non weight bearing with the leg elevated.  she will return to the operating room for definitive fixation when she is medically optimized.  Autumn Benjamin will receive DVT prophylaxis based on other medications, activity level, and risk ratio of bleeding to thrombosis per primary team.  Pin site care will be initiated on postoperative day one.  Autumn Reel, MD [facility] [phone] 6:12 PM
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
## 2015-04-30 ENCOUNTER — Inpatient Hospital Stay (HOSPITAL_COMMUNITY): Payer: No Typology Code available for payment source

## 2015-04-30 DIAGNOSIS — R55 Syncope and collapse: Secondary | ICD-10-CM

## 2015-04-30 DIAGNOSIS — R7989 Other specified abnormal findings of blood chemistry: Secondary | ICD-10-CM

## 2015-04-30 DIAGNOSIS — I4581 Long QT syndrome: Secondary | ICD-10-CM

## 2015-04-30 DIAGNOSIS — S270XXA Traumatic pneumothorax, initial encounter: Secondary | ICD-10-CM | POA: Diagnosis present

## 2015-04-30 DIAGNOSIS — D62 Acute posthemorrhagic anemia: Secondary | ICD-10-CM | POA: Diagnosis present

## 2015-04-30 DIAGNOSIS — J9600 Acute respiratory failure, unspecified whether with hypoxia or hypercapnia: Secondary | ICD-10-CM | POA: Diagnosis not present

## 2015-04-30 DIAGNOSIS — R9431 Abnormal electrocardiogram [ECG] [EKG]: Secondary | ICD-10-CM | POA: Diagnosis present

## 2015-04-30 DIAGNOSIS — S72401A Unspecified fracture of lower end of right femur, initial encounter for closed fracture: Secondary | ICD-10-CM | POA: Diagnosis present

## 2015-04-30 DIAGNOSIS — R571 Hypovolemic shock: Secondary | ICD-10-CM | POA: Diagnosis present

## 2015-04-30 DIAGNOSIS — R778 Other specified abnormalities of plasma proteins: Secondary | ICD-10-CM | POA: Diagnosis present

## 2015-04-30 DIAGNOSIS — S2243XA Multiple fractures of ribs, bilateral, initial encounter for closed fracture: Secondary | ICD-10-CM | POA: Diagnosis present

## 2015-04-30 LAB — CBC
HCT: 25.2 % — ABNORMAL LOW (ref 36.0–46.0)
HCT: 25.3 % — ABNORMAL LOW (ref 36.0–46.0)
HEMOGLOBIN: 8.1 g/dL — AB (ref 12.0–15.0)
HEMOGLOBIN: 8.2 g/dL — AB (ref 12.0–15.0)
MCH: 27.8 pg (ref 26.0–34.0)
MCH: 27.4 pg (ref 26.0–34.0)
MCHC: 32.5 g/dL (ref 30.0–36.0)
MCHC: 32 g/dL (ref 30.0–36.0)
MCV: 85.4 fL (ref 78.0–100.0)
MCV: 85.5 fL (ref 78.0–100.0)
PLATELETS: 142 10*3/uL — AB (ref 150–400)
PLATELETS: 165 10*3/uL (ref 150–400)
RBC: 2.95 MIL/uL — AB (ref 3.87–5.11)
RBC: 2.96 MIL/uL — ABNORMAL LOW (ref 3.87–5.11)
RDW: 15.1 % (ref 11.5–15.5)
RDW: 15.1 % (ref 11.5–15.5)
WBC: 7.8 10*3/uL (ref 4.0–10.5)
WBC: 8.7 10*3/uL (ref 4.0–10.5)

## 2015-04-30 LAB — BASIC METABOLIC PANEL
Anion gap: 7 (ref 5–15)
BUN: 18 mg/dL (ref 6–20)
CALCIUM: 7.8 mg/dL — AB (ref 8.9–10.3)
CHLORIDE: 116 mmol/L — AB (ref 101–111)
CO2: 23 mmol/L (ref 22–32)
CREATININE: 0.82 mg/dL (ref 0.44–1.00)
GFR calc Af Amer: >60 mL/min (ref >60)
GFR calc non Af Amer: >60 mL/min (ref >60)
Glucose, Bld: 150 mg/dL — ABNORMAL HIGH (ref 65–99)
Potassium: 3.7 mmol/L (ref 3.5–5.1)
SODIUM: 146 mmol/L — AB (ref 135–145)

## 2015-04-30 LAB — PREPARE RBC (CROSSMATCH)

## 2015-04-30 LAB — TROPONIN I: TROPONIN I: 0.52 ng/mL — AB (ref <0.031)

## 2015-04-30 MED ORDER — ROPIVACAINE HCL 2 MG/ML IJ SOLN
6.0000 mL/h | INTRAMUSCULAR | Status: DC
  Administered 2015-05-01 – 2015-05-03 (×2): 6 mL/h via EPIDURAL
  Filled 2015-04-30 (×8): qty 200

## 2015-04-30 MED ORDER — HYDROMORPHONE HCL 1 MG/ML IJ SOLN
1.0000 mg | INTRAMUSCULAR | Status: DC | PRN
Start: 2015-04-30 — End: 2015-05-01
  Administered 2015-04-30: 1 mg via INTRAVENOUS
  Administered 2015-05-01 (×3): 0.5 mg via INTRAVENOUS
  Filled 2015-04-30 (×2): qty 1

## 2015-04-30 MED ORDER — HYDROMORPHONE HCL 1 MG/ML IJ SOLN
1.0000 mg | INTRAMUSCULAR | Status: DC | PRN
  Administered 2015-04-30 (×3): 1 mg via INTRAVENOUS
  Filled 2015-04-30 (×2): qty 1

## 2015-04-30 MED ORDER — LABETALOL HCL 5 MG/ML IV SOLN
10.0000 mg | INTRAVENOUS | Status: DC | PRN
  Administered 2015-04-30 – 2015-05-03 (×2): 10 mg via INTRAVENOUS
  Filled 2015-04-30 (×4): qty 4

## 2015-04-30 MED ORDER — ANTISEPTIC ORAL RINSE SOLUTION (CORINZ)
7.0000 mL | OROMUCOSAL | Status: DC
  Administered 2015-04-30 – 2015-05-01 (×5): 7 mL via OROMUCOSAL

## 2015-04-30 MED ORDER — DEXTROSE 5 % IV SOLN
0.0000 ug/min | INTRAVENOUS | Status: DC
  Administered 2015-04-30: 30 ug/min via INTRAVENOUS
  Filled 2015-04-30 (×3): qty 1

## 2015-04-30 MED ORDER — SODIUM CHLORIDE 0.9 % IV SOLN
1.0000 g | Freq: Once | INTRAVENOUS | Status: AC
  Administered 2015-04-30: 1 g via INTRAVENOUS
  Filled 2015-04-30: qty 10

## 2015-04-30 MED ORDER — HYDROMORPHONE HCL 1 MG/ML IJ SOLN
INTRAMUSCULAR | Status: AC
  Filled 2015-04-30: qty 1

## 2015-04-30 MED ORDER — SODIUM CHLORIDE 0.9 % IV SOLN
Freq: Once | INTRAVENOUS | Status: AC
  Administered 2015-04-30: 10:00:00 via INTRAVENOUS
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
## 2015-04-30 NOTE — Progress Notes (Signed)
   Subjective:  Patient extubated, remains on pressors and blood. Pain improved  Objective:   VITALS:   Filed Vitals:   04/30/15 0915 04/30/15 0930 04/30/15 0945 04/30/15 1000  BP:    129/61  Pulse: 124 131 125 131  Temp:    100.9 F (38.3 C)  TempSrc:    Axillary  Resp: 22 24 33 22  Height:      Weight:      SpO2: 99% 98% 99% 99%    Ex fix in place Compartments soft Foot wwp Strong pulses   Lab Results  Component Value Date   WBC 8.7 04/30/2015   HGB 8.1* 04/30/2015   HCT 25.3* 04/30/2015   MCV 85.5 04/30/2015   PLT 165 04/30/2015     Assessment/Plan:  1 Day Post-Op   - patient has provisional fixation and is appropriate until patient has been medically optimized - cards consult pending - remains on pressors for shock - doing well extubated - ORIF next week, possibly by Dr. Rodell Perna, Etter Sjogren 04/30/2015, 11:17 AM [phone]

## 2015-04-30 NOTE — Progress Notes (Signed)
VASCULAR LAB PRELIMINARY  PRELIMINARY  PRELIMINARY  PRELIMINARY  Carotid duplex completed.    Preliminary report:  Bilateral: intimal wall thickening.  Velocities are elevated in the proximal right CCA, right vertebral, proximal left CCA, and distal left ICA, with no significant plaquing noted.  1-39% ICA diameter reduction.  Vertebral artery flow is antegrade.    Madelaine Whipple, RVT 04/30/2015, 7:36 PM

## 2015-04-30 NOTE — Addendum Note (Signed)
Addendum  created 04/30/15 2057 by Lewie Loron, MD   Modules edited: Clinical Notes, Orders   Clinical Notes:  File: [id number]; Pend: [phone]; Pend: [phone]

## 2015-04-30 NOTE — Addendum Note (Signed)
Addendum  created 04/30/15 2056 by Lewie Loron, MD   Modules edited: Clinical Notes   Clinical Notes:  File: [id number]; Pend: [phone]; Pend: [phone]

## 2015-04-30 NOTE — Progress Notes (Signed)
PT Cancellation Note  Patient Details Name: Autumn Benjamin MRN: [id number] DOB: 04-Mar-1941   Cancelled Treatment:    Reason Eval/Treat Not Completed: Medical issues which prohibited therapy.  Per RN pt currently receiving blood and has epidural catheter, hold PT for now.  PT will continue to follow acutely and will attempt to see pt again tomorrow as appropriate.  Thank you for this order.  Michail Jewels PT, DPT [phone] Pager: [phone] 04/30/2015, 10:59 AM

## 2015-04-30 NOTE — Procedures (Signed)
Extubation Procedure Note  Patient Details:   Name: Autumn Benjamin DOB: Dec 06, 1940 MRN: [id number]   Airway Documentation:  Airway 7.5 mm (Active)  Secured at (cm) 22 cm 04/30/2015  7:56 AM  Measured From Lips 04/30/2015  7:56 AM  Secured Location Left 04/30/2015  7:56 AM  Secured By [employer] 04/30/2015  7:56 AM  Tube Holder Repositioned Yes 04/30/2015  7:49 AM  Cuff Pressure (cm H2O) 28 cm H2O 04/30/2015  4:46 AM  Site Condition Dry 04/30/2015  7:56 AM    Evaluation O2 sats: stable throughout   Complications: No apparent complications Patient did tolerate procedure well. Bilateral Breath Sounds: Clear Suctioning: Oral, Airway  RT Note: Patient was extubated per MD order to nasal cannula. Patient had been weaning all morning on cpap/ps with no complications. Patient had a positive cuff leak. No distress noted post extubation and no stridor was present. BBS-clear and equal. HR-113, RR-20, Bp-129/55, Spo2-98%. Patient is stable and RT will continue to monitor.   Idell Pickles 04/30/2015, [phone]

## 2015-04-30 NOTE — Progress Notes (Signed)
Anesthesia Epidural Note  Pt resting in bed, extubated. Pt has had labile BP requiring vasopressor support. Currently she has been weaned off and is hemodynamically stable. Pt is able to take deep breaths (750 ml on IS per RN at bedside). Epidural site with small amount of dried blood. Dressing intact. Decreased temperature sensation from ~T5-T8. Pt has also received two dose enoxaparin. I discontinued that and discussed with the RN and with trauma service. She has 5/5 strength in LLE hamstring, quad, plantar flexion and extension. On right 5/5 plantar flex/ext. Some limitation to her effort due to pain, though. The RN will do q1 hr neuro checks of lower extremities. Infusion 53ml/hr, increased to 5 ml/hr. No bolus given due to recent hemodynamic instability. I discussed this with the RN at bedside and left my direct number if anything changes.  Geraldy Akridge [phone]

## 2015-04-30 NOTE — Progress Notes (Signed)
  Echocardiogram 2D Echocardiogram has been performed.  Delcie Roch 04/30/2015, 3:34 PM
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
## 2015-04-30 NOTE — Progress Notes (Signed)
Pt came from PACU on Neo drip for BP management.  Attempted to wean pt off with no success due to bp dropping.  Pt is also hypothermic.  Dr. Magnus Ivan notified and orders received.  Will continue to monitor.   [name] 04/30/2015. 12:38 AM

## 2015-04-30 NOTE — Addendum Note (Signed)
Addendum  created 04/30/15 2044 by Lewie Loron, MD   Modules edited: Orders

## 2015-04-30 NOTE — Progress Notes (Signed)
Pt c/o new mid to upper back pain, unrelieved by position changes.  Dr. Renold Don notified and will check on patient later.  Pt is still neuro intact and moves all extremities.  Will continue to monitor.    [name] 04/30/2015 9:33 PM

## 2015-05-01 ENCOUNTER — Inpatient Hospital Stay (HOSPITAL_COMMUNITY): Payer: No Typology Code available for payment source

## 2015-05-01 ENCOUNTER — Encounter (HOSPITAL_COMMUNITY): Payer: Self-pay | Admitting: Anesthesiology

## 2015-05-01 LAB — TYPE AND SCREEN
ABO/RH(D): A POS
Antibody Screen: NEGATIVE
UNIT DIVISION: 0
UNIT DIVISION: 0
Unit division: 0

## 2015-05-01 LAB — BASIC METABOLIC PANEL
Anion gap: 9 (ref 5–15)
BUN: 22 mg/dL — ABNORMAL HIGH (ref 6–20)
CHLORIDE: 109 mmol/L (ref 101–111)
CO2: 22 mmol/L (ref 22–32)
CREATININE: 0.75 mg/dL (ref 0.44–1.00)
Calcium: 7.9 mg/dL — ABNORMAL LOW (ref 8.9–10.3)
GFR calc non Af Amer: >60 mL/min (ref >60)
Glucose, Bld: 125 mg/dL — ABNORMAL HIGH (ref 65–99)
POTASSIUM: 4.4 mmol/L (ref 3.5–5.1)
Sodium: 140 mmol/L (ref 135–145)

## 2015-05-01 LAB — CBC
HEMATOCRIT: 25.2 % — AB (ref 36.0–46.0)
HEMOGLOBIN: 8 g/dL — AB (ref 12.0–15.0)
MCH: 28.1 pg (ref 26.0–34.0)
MCHC: 31.7 g/dL (ref 30.0–36.0)
MCV: 88.4 fL (ref 78.0–100.0)
Platelets: 109 10*3/uL — ABNORMAL LOW (ref 150–400)
RBC: 2.85 MIL/uL — AB (ref 3.87–5.11)
RDW: 15.6 % — ABNORMAL HIGH (ref 11.5–15.5)
WBC: 10 10*3/uL (ref 4.0–10.5)

## 2015-05-01 MED ORDER — POLYETHYLENE GLYCOL 3350 17 G PO PACK
17.0000 g | PACK | Freq: Every day | ORAL | Status: DC
  Administered 2015-05-01 – 2015-05-05 (×2): 17 g via ORAL
  Filled 2015-05-01 (×5): qty 1

## 2015-05-01 MED ORDER — TRAMADOL HCL 50 MG PO TABS
50.0000 mg | ORAL_TABLET | Freq: Four times a day (QID) | ORAL | Status: DC | PRN
  Administered 2015-05-01: 50 mg via ORAL
  Administered 2015-05-01 – 2015-05-05 (×4): 100 mg via ORAL
  Filled 2015-05-01: qty 1
  Filled 2015-05-01 (×4): qty 2

## 2015-05-01 MED ORDER — HYDROMORPHONE HCL 1 MG/ML IJ SOLN
0.5000 mg | INTRAMUSCULAR | Status: DC | PRN
  Administered 2015-05-01 – 2015-05-05 (×13): 0.5 mg via INTRAVENOUS
  Filled 2015-05-01 (×13): qty 1

## 2015-05-01 NOTE — Progress Notes (Signed)
Rechecked pt neuro status due to upper back pain. Pain has been stable and no change in neuro exam. Will continue to follow.  Autumn Benjamin

## 2015-05-01 NOTE — Progress Notes (Signed)
Anesthesia Epidural check Pt with good respiratory function, but still has significant chest pain at rest. Normal neurologic exam with good strength.  C/O some pain in R. Scapula.  Hemodynamics have been stable off pressors.  Will increase infusion as tolerated to reduce rest  pain. Continue to follow.
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
## 2015-05-01 NOTE — Progress Notes (Signed)
   Subjective:  Patient off pressors  Objective:   VITALS:   Filed Vitals:   05/01/15 0930 05/01/15 1000 05/01/15 1100 05/01/15 1124  BP: 119/82 116/65 128/61   Pulse: 102 90 86   Temp:    98.4 F (36.9 C)  TempSrc:    Oral  Resp: 20 6 7    Height:      Weight:      SpO2: 100% 100% 100%     Ex fix in place Compartments soft Foot wwp Strong pulses   Lab Results  Component Value Date   WBC 10.0 05/01/2015   HGB 8.0* 05/01/2015   HCT 25.2* 05/01/2015   MCV 88.4 05/01/2015   PLT 109* 05/01/2015     Assessment/Plan:  2 Days Post-Op   - patient has provisional fixation and is appropriate until patient has been medically optimized - cards consult pending - off pressors - will await dispo from EP cards - stable from ortho stand point - pin site care starting today  13/11/2014 05/01/2015, 11:48 AM [phone]

## 2015-05-01 NOTE — Addendum Note (Signed)
Addendum  created 05/01/15 4917 by Lewie Loron, MD   Modules edited: Clinical Notes   Clinical Notes:  File: [id number]

## 2015-05-01 NOTE — Progress Notes (Signed)
PT Cancellation Note  Patient Details Name: Ayana Imhof MRN: [id number] DOB: [birth year]   Cancelled Treatment:    Reason Eval/Treat Not Completed: Patient not medically ready (Just had chest tube removed).  Will wait for chest x-ray results before mobilizing pt.   Michail Jewels PT, DPT [phone] Pager: [phone] 05/01/2015, 2:44 PM

## 2015-05-02 ENCOUNTER — Encounter (HOSPITAL_COMMUNITY): Payer: Self-pay | Admitting: Orthopaedic Surgery

## 2015-05-02 ENCOUNTER — Inpatient Hospital Stay (HOSPITAL_COMMUNITY): Payer: No Typology Code available for payment source

## 2015-05-02 LAB — CBC
HEMATOCRIT: 25.7 % — AB (ref 36.0–46.0)
HEMOGLOBIN: 8.1 g/dL — AB (ref 12.0–15.0)
MCH: 27.5 pg (ref 26.0–34.0)
MCHC: 31.5 g/dL (ref 30.0–36.0)
MCV: 87.1 fL (ref 78.0–100.0)
Platelets: 89 10*3/uL — ABNORMAL LOW (ref 150–400)
RBC: 2.95 MIL/uL — AB (ref 3.87–5.11)
RDW: 15.1 % (ref 11.5–15.5)
WBC: 8.8 10*3/uL (ref 4.0–10.5)

## 2015-05-02 LAB — BASIC METABOLIC PANEL
Anion gap: 5 (ref 5–15)
BUN: 12 mg/dL (ref 6–20)
CHLORIDE: 109 mmol/L (ref 101–111)
CO2: 25 mmol/L (ref 22–32)
CREATININE: 0.47 mg/dL (ref 0.44–1.00)
Calcium: 8.1 mg/dL — ABNORMAL LOW (ref 8.9–10.3)
GFR calc Af Amer: >60 mL/min (ref >60)
GFR calc non Af Amer: >60 mL/min (ref >60)
GLUCOSE: 115 mg/dL — AB (ref 65–99)
POTASSIUM: 5.1 mmol/L (ref 3.5–5.1)
Sodium: 139 mmol/L (ref 135–145)

## 2015-05-02 MED ORDER — FUROSEMIDE 10 MG/ML IJ SOLN
20.0000 mg | Freq: Once | INTRAMUSCULAR | Status: AC
  Administered 2015-05-02: 20 mg via INTRAVENOUS
  Filled 2015-05-02: qty 2

## 2015-05-02 MED ORDER — OXYCODONE HCL 5 MG PO TABS
5.0000 mg | ORAL_TABLET | ORAL | Status: DC | PRN
  Administered 2015-05-02 (×2): 10 mg via ORAL
  Filled 2015-05-02 (×3): qty 2

## 2015-05-02 NOTE — Progress Notes (Signed)
   Subjective:  Patient is stable  Objective:   VITALS:   Filed Vitals:   05/02/15 0600 05/02/15 0700 05/02/15 0720 05/02/15 0739  BP: 160/90 164/102 160/80   Pulse: 90 100 94   Temp:    98.5 F (36.9 C)  TempSrc:    Oral  Resp: 18 21 9    Height:      Weight:      SpO2: 100% 98% 100%     Ex fix in place Compartments soft Foot wwp Strong pulses   Lab Results  Component Value Date   WBC 8.8 05/02/2015   HGB 8.1* 05/02/2015   HCT 25.7* 05/02/2015   MCV 87.1 05/02/2015   PLT 89* 05/02/2015     Assessment/Plan:  3 Days Post-Op   - cards consult pending today - Dr. 13/12/2014 is not available this week - I will plan on taking patient for ORIF Wednesday if medically optimized  Friday 05/02/2015, 7:53 AM [phone]
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
## 2015-05-02 NOTE — Progress Notes (Addendum)
Trauma Service Note  Subjective: Patient is very awake and alert.  No delirium.  Nauseated.   Objective: Vital signs in last 24 hours: Temp:  [97.8 F (36.6 C)-98.5 F (36.9 C)] 98.5 F (36.9 C) (11/07 0739) Pulse Rate:  [83-111] 94 (11/07 0720) Resp:  [6-24] 9 (11/07 0720) BP: (98-175)/(60-140) 160/80 mmHg (11/07 0720) SpO2:  [96 %-100 %] 100 % (11/07 0720) Arterial Line BP: (118-203)/(48-93) 185/83 mmHg (11/07 0720) Last BM Date: 04/29/15  Intake/Output from previous day: 11/06 0701 - 11/07 0700 In: 2828.8 [P.O.:1440; I.V.:1246.7] Out: 1870 [Urine:1795; Chest Tube:75] Intake/Output this shift:    General: Nauseated.  Pain controlled as long as she does not move.  Lungs: Diminished in the right base.  No chest wall crepitance.  CXR shows a small right  Apical PTX and interstitial edema.  Abd: Benign, good bowel sounds.  Extremities: RLE in external fixative device.  Foot is warm and viable.  Neuro: Intact.  Lab Results: CBC   Recent Labs  05/01/15 0455 05/02/15 0550  WBC 10.0 8.8  HGB 8.0* 8.1*  HCT 25.2* 25.7*  PLT 109* 89*   BMET  Recent Labs  04/30/15 0455 05/01/15 0455  NA 146* 140  K 3.7 4.4  CL 116* 109  CO2 23 22  GLUCOSE 150* 125*  BUN 18 22*  CREATININE 0.82 0.75  CALCIUM 7.8* 7.9*   PT/INR  Recent Labs  04/29/15 1150  LABPROT 15.6*  INR 1.22   ABG  Recent Labs  04/29/15 2240  PHART 7.309*  HCO3 20.6    Studies/Results: Dg Chest Port 1 View  05/02/2015  CLINICAL DATA:  Traumatic hemo pneumothorax. EXAM: PORTABLE CHEST 1 VIEW COMPARISON:  05/01/2015.  CT 04/29/2015. FINDINGS: Mediastinum and hilar structures are normal. Cardiomegaly with normal pulmonary vascularity. Mild patchy bilateral pulmonary infiltrates noted. Small left pleural effusion. Stable right apical pneumothorax. Right chest wall subcutaneous emphysema again noted. Sliding hiatal hernia. IMPRESSION: 1. Stable right apical pneumothorax. Right chest wall  subcutaneous emphysema again noted. 2. Mild patchy bilateral pulmonary infiltrates. 3. Stable cardiomegaly. 4. Hiatal hernia. 5. Right rib fractures, best demonstrated by prior CT . Electronically Signed   By: Maisie Fus  Register   On: 05/02/2015 07:44   Dg Chest Port 1 View  05/01/2015  CLINICAL DATA:  Chest tube removal EXAM: PORTABLE CHEST 1 VIEW COMPARISON:  Chest radiograph from earlier today. FINDINGS: Stable cardiomediastinal silhouette with top-normal heart size and large hiatal hernia. Small (5-10%) right apical pneumothorax is slightly decreased. No left pneumothorax. No right pleural effusion. Stable small left pleural effusion. No overt pulmonary edema. Patchy opacity at the left lung base appears stable. Subcutaneous emphysema in the lateral right chest wall appears stable. Multiple right upper rib fractures are again noted. IMPRESSION: 1. Small right apical pneumothorax, slightly decreased. 2. Stable small left pleural effusion. 3. Stable patchy left lung base opacity, likely atelectasis. 4. Large hiatal hernia. Electronically Signed   By: Delbert Phenix M.D.   On: 05/01/2015 15:43   Dg Chest Port 1 View  05/01/2015  CLINICAL DATA:  Traumatic hemo pneumothorax. EXAM: PORTABLE CHEST 1 VIEW COMPARISON:  04/30/2015 FINDINGS: Right-sided chest tube remains under advanced. The side port is at or outside the margin of the chest wall. Persistent right upper lobe pneumothorax. This is similar in volume to the previous exam. Left pleural effusion appears increased in volume from prior study. IMPRESSION: 1. Persistent pneumothorax overlying the right upper lobe. The chest tube may need to be advanced. 2. Left pleural effusion.  Increased from previous exam. These results will be called to the ordering clinician or representative by the Radiologist Assistant, and communication documented in the PACS or zVision Dashboard. Electronically Signed   By: Signa Kell M.D.   On: 05/01/2015 07:39     Anti-infectives: Anti-infectives    None      Assessment/Plan: s/p Procedure(s): PLACEMENT OF EXTERNAL FIXATOR FOR DISTAL FEMUR FRACTURE Continue foley due to diuresing patient  LOS: 3 days   Marta Lamas. Gae Bon, MD, FACS [phone] Trauma Surgeon 05/02/2015  Intermittent bradycardia episodes.
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
## 2015-05-02 NOTE — Progress Notes (Signed)
Anesthesia Epidural check 05/02/15:  Patient with good respiratory function.  Chest pain controlled at rest.  Does report pain with movement.  Normal neurologic exam with good strength. Hemodynamics have been stable off pressors. Will continue epidural infusion as tolerated to reduce pain.  Discussed with Dr. Lindie Spruce. Continue to follow.  Rondall Allegra, MD Anesthesiology
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
## 2015-05-03 ENCOUNTER — Inpatient Hospital Stay (HOSPITAL_COMMUNITY): Payer: No Typology Code available for payment source

## 2015-05-03 ENCOUNTER — Encounter (HOSPITAL_COMMUNITY): Payer: Self-pay | Admitting: Orthopedic Surgery

## 2015-05-03 ENCOUNTER — Encounter (HOSPITAL_COMMUNITY): Admission: EM | Disposition: A | Discharge status: Rehabilitation Facility | Payer: Self-pay | Source: Home / Self Care

## 2015-05-03 ENCOUNTER — Inpatient Hospital Stay (HOSPITAL_COMMUNITY): Payer: No Typology Code available for payment source | Admitting: Certified Registered Nurse Anesthetist

## 2015-05-03 HISTORY — PX: EXTERNAL FIXATION REMOVAL: SHX5040

## 2015-05-03 HISTORY — PX: ORIF FEMUR FRACTURE: SHX2119

## 2015-05-03 LAB — CBC WITH DIFFERENTIAL/PLATELET
Basophils Absolute: 0 10*3/uL (ref 0.0–0.1)
Basophils Relative: 0 %
EOS ABS: 0.1 10*3/uL (ref 0.0–0.7)
EOS PCT: 2 %
HCT: 25.5 % — ABNORMAL LOW (ref 36.0–46.0)
Hemoglobin: 8.2 g/dL — ABNORMAL LOW (ref 12.0–15.0)
LYMPHS ABS: 0.8 10*3/uL (ref 0.7–4.0)
LYMPHS PCT: 11 %
MCH: 27.7 pg (ref 26.0–34.0)
MCHC: 32.2 g/dL (ref 30.0–36.0)
MCV: 86.1 fL (ref 78.0–100.0)
MONO ABS: 0.8 10*3/uL (ref 0.1–1.0)
MONOS PCT: 10 %
Neutro Abs: 5.7 10*3/uL (ref 1.7–7.7)
Neutrophils Relative %: 77 %
PLATELETS: 107 10*3/uL — AB (ref 150–400)
RBC: 2.96 MIL/uL — AB (ref 3.87–5.11)
RDW: 14.7 % (ref 11.5–15.5)
WBC: 7.4 10*3/uL (ref 4.0–10.5)

## 2015-05-03 LAB — TYPE AND SCREEN
ABO/RH(D): A POS
Antibody Screen: NEGATIVE

## 2015-05-03 LAB — BASIC METABOLIC PANEL
Anion gap: 9 (ref 5–15)
BUN: 10 mg/dL (ref 6–20)
CO2: 27 mmol/L (ref 22–32)
CREATININE: 0.58 mg/dL (ref 0.44–1.00)
Calcium: 8.3 mg/dL — ABNORMAL LOW (ref 8.9–10.3)
Chloride: 103 mmol/L (ref 101–111)
GFR calc Af Amer: >60 mL/min (ref >60)
GLUCOSE: 102 mg/dL — AB (ref 65–99)
POTASSIUM: 3.7 mmol/L (ref 3.5–5.1)
SODIUM: 139 mmol/L (ref 135–145)

## 2015-05-03 LAB — PROTIME-INR
INR: 1.09 (ref 0.00–1.49)
PROTHROMBIN TIME: 14.3 s (ref 11.6–15.2)

## 2015-05-03 LAB — APTT: APTT: 27 s (ref 24–37)

## 2015-05-03 SURGERY — REMOVAL, EXTERNAL FIXATION DEVICE, LOWER EXTREMITY
Anesthesia: General | Site: Leg Upper | Laterality: Right

## 2015-05-03 MED ORDER — GLYCOPYRROLATE 0.2 MG/ML IJ SOLN
INTRAMUSCULAR | Status: DC | PRN
  Administered 2015-05-03: 0.3 mg via INTRAVENOUS

## 2015-05-03 MED ORDER — CEFAZOLIN SODIUM-DEXTROSE 2-3 GM-% IV SOLR
2.0000 g | Freq: Three times a day (TID) | INTRAVENOUS | Status: AC
  Administered 2015-05-03 – 2015-05-04 (×3): 2 g via INTRAVENOUS
  Filled 2015-05-03 (×3): qty 50

## 2015-05-03 MED ORDER — ONDANSETRON HCL 4 MG/2ML IJ SOLN
INTRAMUSCULAR | Status: DC | PRN
  Administered 2015-05-03: 4 mg via INTRAVENOUS

## 2015-05-03 MED ORDER — FENTANYL CITRATE (PF) 100 MCG/2ML IJ SOLN
INTRAMUSCULAR | Status: DC | PRN
  Administered 2015-05-03: 50 ug via INTRAVENOUS
  Administered 2015-05-03: 100 ug via INTRAVENOUS
  Administered 2015-05-03 (×5): 50 ug via INTRAVENOUS

## 2015-05-03 MED ORDER — PROPOFOL 10 MG/ML IV BOLUS
INTRAVENOUS | Status: DC | PRN
  Administered 2015-05-03: 140 mg via INTRAVENOUS

## 2015-05-03 MED ORDER — LACTATED RINGERS IV SOLN
INTRAVENOUS | Status: DC | PRN
Start: 2015-05-03 — End: 2015-05-03
  Administered 2015-05-03: 14:00:00 via INTRAVENOUS

## 2015-05-03 MED ORDER — WHITE PETROLATUM GEL
Status: AC
  Filled 2015-05-03: qty 1

## 2015-05-03 MED ORDER — NEOSTIGMINE METHYLSULFATE 10 MG/10ML IV SOLN
INTRAVENOUS | Status: DC | PRN
  Administered 2015-05-03: 2.5 mg via INTRAVENOUS

## 2015-05-03 MED ORDER — PHENYLEPHRINE HCL 10 MG/ML IJ SOLN
INTRAMUSCULAR | Status: DC | PRN
  Administered 2015-05-03: 80 ug via INTRAVENOUS
  Administered 2015-05-03: 120 ug via INTRAVENOUS

## 2015-05-03 MED ORDER — FENTANYL CITRATE (PF) 250 MCG/5ML IJ SOLN
INTRAMUSCULAR | Status: AC
  Filled 2015-05-03: qty 5

## 2015-05-03 MED ORDER — ARTIFICIAL TEARS OP OINT
TOPICAL_OINTMENT | OPHTHALMIC | Status: AC
  Filled 2015-05-03: qty 3.5

## 2015-05-03 MED ORDER — LEVOTHYROXINE SODIUM 100 MCG IV SOLR
50.0000 ug | Freq: Every day | INTRAVENOUS | Status: DC
  Administered 2015-05-03 – 2015-05-04 (×2): 50 ug via INTRAVENOUS
  Filled 2015-05-03 (×2): qty 5

## 2015-05-03 MED ORDER — ROCURONIUM BROMIDE 50 MG/5ML IV SOLN
INTRAVENOUS | Status: AC
  Filled 2015-05-03: qty 1

## 2015-05-03 MED ORDER — CEFAZOLIN SODIUM-DEXTROSE 2-3 GM-% IV SOLR
INTRAVENOUS | Status: DC | PRN
  Administered 2015-05-03: 2 g via INTRAVENOUS

## 2015-05-03 MED ORDER — DEXTROSE 5 % IV SOLN
10.0000 mg | INTRAVENOUS | Status: DC | PRN
  Administered 2015-05-03: 40 ug/min via INTRAVENOUS

## 2015-05-03 MED ORDER — OXYCODONE HCL 5 MG PO TABS: 5.0000 mg | ORAL_TABLET | ORAL | Status: DC | PRN

## 2015-05-03 MED ORDER — LIDOCAINE HCL (CARDIAC) 20 MG/ML IV SOLN
INTRAVENOUS | Status: AC
  Filled 2015-05-03: qty 5

## 2015-05-03 MED ORDER — ROCURONIUM BROMIDE 100 MG/10ML IV SOLN
INTRAVENOUS | Status: DC | PRN
  Administered 2015-05-03: 30 mg via INTRAVENOUS
  Administered 2015-05-03: 20 mg via INTRAVENOUS

## 2015-05-03 MED ORDER — SUCCINYLCHOLINE CHLORIDE 20 MG/ML IJ SOLN
INTRAMUSCULAR | Status: DC | PRN
  Administered 2015-05-03: 100 mg via INTRAVENOUS
  Administered 2015-05-03: 9 mg via INTRAVENOUS

## 2015-05-03 MED ORDER — 0.9 % SODIUM CHLORIDE (POUR BTL) OPTIME
TOPICAL | Status: DC | PRN
  Administered 2015-05-03: 1000 mL

## 2015-05-03 MED ORDER — ACETAMINOPHEN 10 MG/ML IV SOLN
1000.0000 mg | Freq: Four times a day (QID) | INTRAVENOUS | Status: DC
  Administered 2015-05-03 – 2015-05-04 (×2): 1000 mg via INTRAVENOUS
  Filled 2015-05-03 (×4): qty 100

## 2015-05-03 MED ORDER — CEFAZOLIN SODIUM-DEXTROSE 2-3 GM-% IV SOLR
2.0000 g | Freq: Three times a day (TID) | INTRAVENOUS | Status: DC
  Filled 2015-05-03 (×2): qty 50

## 2015-05-03 MED ORDER — LACTATED RINGERS IV SOLN
INTRAVENOUS | Status: DC | PRN
  Administered 2015-05-03: 14:00:00 via INTRAVENOUS

## 2015-05-03 MED ORDER — ONDANSETRON HCL 4 MG/2ML IJ SOLN
INTRAMUSCULAR | Status: AC
  Filled 2015-05-03: qty 2

## 2015-05-03 MED ORDER — LIDOCAINE HCL (CARDIAC) 20 MG/ML IV SOLN
INTRAVENOUS | Status: DC | PRN
  Administered 2015-05-03: 60 mg via INTRAVENOUS

## 2015-05-03 SURGICAL SUPPLY — 81 items
BANDAGE ELASTIC 4 VELCRO ST LF (GAUZE/BANDAGES/DRESSINGS) ×3 IMPLANT
BANDAGE ELASTIC 6 VELCRO ST LF (GAUZE/BANDAGES/DRESSINGS) ×3 IMPLANT
BIT DRILL CALIBRATED 4.3MMX365 (DRILL) ×2 IMPLANT
BIT DRILL CROWE PNT TWST 4.5MM (DRILL) ×2 IMPLANT
BNDG COHESIVE 6X5 TAN STRL LF (GAUZE/BANDAGES/DRESSINGS) ×3 IMPLANT
BNDG GAUZE ELAST 4 BULKY (GAUZE/BANDAGES/DRESSINGS) ×3 IMPLANT
BRUSH SCRUB DISP (MISCELLANEOUS) ×6 IMPLANT
COVER SURGICAL LIGHT HANDLE (MISCELLANEOUS) ×3 IMPLANT
DRAPE C-ARM 42X72 X-RAY (DRAPES) ×3 IMPLANT
DRAPE C-ARMOR (DRAPES) ×3 IMPLANT
DRAPE ORTHO SPLIT 77X108 STRL (DRAPES) ×3
DRAPE SURG ORHT 6 SPLT 77X108 (DRAPES) ×6 IMPLANT
DRAPE TABLE COVER HEAVY DUTY (DRAPES) ×3 IMPLANT
DRAPE U-SHAPE 47X51 STRL (DRAPES) ×3 IMPLANT
DRILL CALIBRATED 4.3MMX365 (DRILL) ×3
DRILL CROWE POINT TWIST 4.5MM (DRILL) ×3
DRSG ADAPTIC 3X8 NADH LF (GAUZE/BANDAGES/DRESSINGS) ×3 IMPLANT
DRSG MEPILEX BORDER 4X4 (GAUZE/BANDAGES/DRESSINGS) ×3 IMPLANT
DRSG MEPITEL 3X4 ME34 (GAUZE/BANDAGES/DRESSINGS) ×3 IMPLANT
DRSG PAD ABDOMINAL 8X10 ST (GAUZE/BANDAGES/DRESSINGS) ×12 IMPLANT
ELECT REM PT RETURN 9FT ADLT (ELECTROSURGICAL) ×3
ELECTRODE REM PT RTRN 9FT ADLT (ELECTROSURGICAL) ×2 IMPLANT
EVACUATOR 1/8 PVC DRAIN (DRAIN) IMPLANT
EVACUATOR 3/16  PVC DRAIN (DRAIN)
EVACUATOR 3/16 PVC DRAIN (DRAIN) IMPLANT
GAUZE SPONGE 4X4 12PLY STRL (GAUZE/BANDAGES/DRESSINGS) ×3 IMPLANT
GAUZE XEROFORM 5X9 LF (GAUZE/BANDAGES/DRESSINGS) ×3 IMPLANT
GLOVE BIOGEL PI IND STRL 7.0 (GLOVE) ×10 IMPLANT
GLOVE BIOGEL PI IND STRL 7.5 (GLOVE) ×2 IMPLANT
GLOVE BIOGEL PI IND STRL 8 (GLOVE) ×2 IMPLANT
GLOVE BIOGEL PI INDICATOR 7.0 (GLOVE) ×5
GLOVE BIOGEL PI INDICATOR 7.5 (GLOVE) ×1
GLOVE BIOGEL PI INDICATOR 8 (GLOVE) ×1
GLOVE SURG SS PI 6.5 STRL IVOR (GLOVE) ×12 IMPLANT
GLOVE SURG SS PI 7.0 STRL IVOR (GLOVE) ×9 IMPLANT
GLOVE SURG SS PI 7.5 STRL IVOR (GLOVE) ×9 IMPLANT
GLOVE SURG SS PI 8.0 STRL IVOR (GLOVE) ×9 IMPLANT
GOWN STRL REUS W/ TWL LRG LVL3 (GOWN DISPOSABLE) ×6 IMPLANT
GOWN STRL REUS W/ TWL XL LVL3 (GOWN DISPOSABLE) ×2 IMPLANT
GOWN STRL REUS W/TWL LRG LVL3 (GOWN DISPOSABLE) ×3
GOWN STRL REUS W/TWL XL LVL3 (GOWN DISPOSABLE) ×1
GUIDEPIN 3.2X17.5 THRD DISP (PIN) ×3 IMPLANT
GUIDEWIRE BEAD TIP (WIRE) ×3 IMPLANT
KIT BASIN OR (CUSTOM PROCEDURE TRAY) ×3 IMPLANT
KIT ROOM TURNOVER OR (KITS) ×3 IMPLANT
MANIFOLD NEPTUNE II (INSTRUMENTS) ×3 IMPLANT
NAIL FEM RETRO 10.5X380 (Nail) ×3 IMPLANT
NS IRRIG 1000ML POUR BTL (IV SOLUTION) ×3 IMPLANT
PACK ORTHO EXTREMITY (CUSTOM PROCEDURE TRAY) ×3 IMPLANT
PACK UNIVERSAL I (CUSTOM PROCEDURE TRAY) ×6 IMPLANT
PAD ARMBOARD 7.5X6 YLW CONV (MISCELLANEOUS) ×6 IMPLANT
PAD CAST 4YDX4 CTTN HI CHSV (CAST SUPPLIES) ×2 IMPLANT
PADDING CAST COTTON 4X4 STRL (CAST SUPPLIES) ×1
PADDING CAST COTTON 6X4 STRL (CAST SUPPLIES) ×3 IMPLANT
SCREW CORT TI DBL LEAD 5X34 (Screw) ×6 IMPLANT
SCREW CORT TI DBL LEAD 5X60 (Screw) ×6 IMPLANT
SCREW CORT TI DBL LEAD 5X65 (Screw) ×3 IMPLANT
SCREW CORT TI DBL LEAD 5X70 (Screw) ×3 IMPLANT
SCREW CORT TI DBL LEAD 5X75 (Screw) ×3 IMPLANT
SPONGE GAUZE 4X4 12PLY STER LF (GAUZE/BANDAGES/DRESSINGS) ×3 IMPLANT
SPONGE LAP 18X18 X RAY DECT (DISPOSABLE) ×3 IMPLANT
SPONGE SCRUB IODOPHOR (GAUZE/BANDAGES/DRESSINGS) ×3 IMPLANT
STAPLER VISISTAT 35W (STAPLE) ×3 IMPLANT
STOCKINETTE IMPERVIOUS LG (DRAPES) ×3 IMPLANT
SUCTION FRAZIER TIP 10 FR DISP (SUCTIONS) ×3 IMPLANT
SUT ETHILON 3 0 PS 1 (SUTURE) ×6 IMPLANT
SUT PDS AB 2-0 CT1 27 (SUTURE) ×6 IMPLANT
SUT PROLENE 0 CT 2 (SUTURE) IMPLANT
SUT VIC AB 0 CT1 27 (SUTURE) ×2
SUT VIC AB 0 CT1 27XBRD ANBCTR (SUTURE) ×4 IMPLANT
SUT VIC AB 1 CT1 27 (SUTURE) ×2
SUT VIC AB 1 CT1 27XBRD ANBCTR (SUTURE) ×4 IMPLANT
SUT VIC AB 2-0 CT1 27 (SUTURE) ×2
SUT VIC AB 2-0 CT1 TAPERPNT 27 (SUTURE) ×4 IMPLANT
SYR BULB IRRIGATION 50ML (SYRINGE) ×3 IMPLANT
TOWEL OR 17X24 6PK STRL BLUE (TOWEL DISPOSABLE) ×3 IMPLANT
TOWEL OR 17X26 10 PK STRL BLUE (TOWEL DISPOSABLE) ×6 IMPLANT
TUBE CONNECTING 12X1/4 (SUCTIONS) ×3 IMPLANT
UNDERPAD 30X30 INCONTINENT (UNDERPADS AND DIAPERS) ×12 IMPLANT
WATER STERILE IRR 1000ML POUR (IV SOLUTION) ×6 IMPLANT
YANKAUER SUCT BULB TIP NO VENT (SUCTIONS) ×3 IMPLANT

## 2015-05-03 NOTE — Transfer of Care (Signed)
Immediate Anesthesia Transfer of Care Note  Patient: Autumn Benjamin  Procedure(s) Performed: Procedure(s): REMOVAL EXTERNAL FIXATION LEG (Right) OPEN REDUCTION INTERNAL FIXATION (ORIF) PERIPROSTHETIC DISTAL FEMUR FRACTURE (Right)  Patient Location: PACU  Anesthesia Type:General  Level of Consciousness: awake, sedated, patient cooperative and responds to stimulation  Airway & Oxygen Therapy: Patient Spontanous Breathing and Patient connected to nasal cannula oxygen  Post-op Assessment: Report given to RN, Post -op Vital signs reviewed and stable, Patient moving all extremities and Patient moving all extremities X 4  Post vital signs: Reviewed and stable  Last Vitals:  Filed Vitals:   05/03/15 1200  BP: 154/73  Pulse: 99  Temp:   Resp: 12    Complications: No apparent anesthesia complications
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
## 2015-05-03 NOTE — Progress Notes (Signed)
Pt transferred from 66M to 3S08 after report was received from RN.  Pt oriented to unit and surroundings and has call bell within reach.

## 2015-05-03 NOTE — Care Management Important Message (Signed)
Important Message  Patient Details  Name: Autumn Benjamin MRN: [id number] Date of Birth: [birth year]   Medicare Important Message Given:  Yes-second notification given    Kyla Balzarine 05/03/2015, 10:23 AM
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
## 2015-05-03 NOTE — Anesthesia Postprocedure Evaluation (Signed)
Anesthesia Post Note  Patient: Autumn Benjamin  Procedure(s) Performed: Procedure(s) (LRB): REMOVAL EXTERNAL FIXATION LEG (Right) OPEN REDUCTION INTERNAL FIXATION (ORIF) PERIPROSTHETIC DISTAL FEMUR FRACTURE (Right)  Anesthesia type: General  Patient location: PACU  Post pain: Pain level controlled and Adequate analgesia  Post assessment: Post-op Vital signs reviewed, Patient's Cardiovascular Status Stable, Respiratory Function Stable, Patent Airway and Pain level controlled  Last Vitals:  Filed Vitals:   05/03/15 1200  BP: 154/73  Pulse: 99  Temp:   Resp: 12    Post vital signs: Reviewed and stable  Level of consciousness: awake, alert  and oriented  Complications: No apparent anesthesia complications
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
## 2015-05-03 NOTE — Anesthesia Procedure Notes (Signed)
Procedure Name: Intubation Date/Time: 05/03/2015 2:14 PM Performed by: Faustino Congress Olanda Boughner Pre-anesthesia Checklist: Patient identified, Emergency Drugs available, Suction available and Patient being monitored Patient Re-evaluated:Patient Re-evaluated prior to inductionOxygen Delivery Method: Circle system utilized Preoxygenation: Pre-oxygenation with 100% oxygen Intubation Type: IV induction Ventilation: Mask ventilation without difficulty Laryngoscope Size: Mac and 3 Grade View: Grade I Tube type: Oral Tube size: 7.0 mm Number of attempts: 1 Airway Equipment and Method: Stylet Placement Confirmation: ETT inserted through vocal cords under direct vision,  positive ETCO2 and breath sounds checked- equal and bilateral Secured at: 22 cm Tube secured with: Tape Dental Injury: Teeth and Oropharynx as per pre-operative assessment

## 2015-05-03 NOTE — Progress Notes (Signed)
   Subjective:  Patient stable in step down unit.  Objective:   VITALS:   Filed Vitals:   05/03/15 0314 05/03/15 0400 05/03/15 0600 05/03/15 0700  BP:  141/75 117/67   Pulse:  97 93   Temp: 98.6 F (37 C)   98.4 F (36.9 C)  TempSrc: Oral   Axillary  Resp:  14 7   Height:      Weight:      SpO2:  100% 97%     Ex fix in place Compartments soft Foot wwp Strong pulses   Lab Results  Component Value Date   WBC 7.4 05/03/2015   HGB 8.2* 05/03/2015   HCT 25.5* 05/03/2015   MCV 86.1 05/03/2015   PLT 107* 05/03/2015     Assessment/Plan:  4 Days Post-Op   - Dr. Carola Frost to speak with patient this morning for possible ORIF this afternoon  Cheral Almas 05/03/2015, 7:45 AM [phone]

## 2015-05-03 NOTE — Progress Notes (Signed)
Patient returned to 3S08 from PACU.  VSS.
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
## 2015-05-03 NOTE — Progress Notes (Signed)
To OR today for ORIF of Rt distal femur.  Will follow post op for therapy recommendations.  Will likely need inpatient rehab.    Quintella Baton, RN, BSN  Trauma/Neuro ICU Case Manager [phone]
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
## 2015-05-04 ENCOUNTER — Inpatient Hospital Stay (HOSPITAL_COMMUNITY): Payer: No Typology Code available for payment source

## 2015-05-04 ENCOUNTER — Encounter (HOSPITAL_COMMUNITY): Payer: Self-pay | Admitting: Orthopedic Surgery

## 2015-05-04 LAB — COMPREHENSIVE METABOLIC PANEL
ALT: 102 U/L — ABNORMAL HIGH (ref 14–54)
ANION GAP: 9 (ref 5–15)
AST: 45 U/L — ABNORMAL HIGH (ref 15–41)
Albumin: 2.5 g/dL — ABNORMAL LOW (ref 3.5–5.0)
Alkaline Phosphatase: 122 U/L (ref 38–126)
BILIRUBIN TOTAL: 1.2 mg/dL (ref 0.3–1.2)
BUN: 10 mg/dL (ref 6–20)
CO2: 25 mmol/L (ref 22–32)
CREATININE: 0.57 mg/dL (ref 0.44–1.00)
Calcium: 8 mg/dL — ABNORMAL LOW (ref 8.9–10.3)
Chloride: 104 mmol/L (ref 101–111)
Glucose, Bld: 107 mg/dL — ABNORMAL HIGH (ref 65–99)
POTASSIUM: 3.5 mmol/L (ref 3.5–5.1)
Sodium: 138 mmol/L (ref 135–145)
Total Protein: 4.8 g/dL — ABNORMAL LOW (ref 6.5–8.1)

## 2015-05-04 LAB — CBC
HEMATOCRIT: 24.6 % — AB (ref 36.0–46.0)
Hemoglobin: 8.1 g/dL — ABNORMAL LOW (ref 12.0–15.0)
MCH: 28.1 pg (ref 26.0–34.0)
MCHC: 32.9 g/dL (ref 30.0–36.0)
MCV: 85.4 fL (ref 78.0–100.0)
Platelets: 115 10*3/uL — ABNORMAL LOW (ref 150–400)
RBC: 2.88 MIL/uL — ABNORMAL LOW (ref 3.87–5.11)
RDW: 14.7 % (ref 11.5–15.5)
WBC: 8.5 10*3/uL (ref 4.0–10.5)

## 2015-05-04 MED ORDER — PANTOPRAZOLE SODIUM 40 MG PO TBEC: 40.0000 mg | DELAYED_RELEASE_TABLET | Freq: Every day | ORAL | Status: DC

## 2015-05-04 MED ORDER — LEVOTHYROXINE SODIUM 100 MCG PO TABS
100.0000 ug | ORAL_TABLET | Freq: Every day | ORAL | Status: DC
  Administered 2015-05-05 – 2015-05-06 (×2): 100 ug via ORAL
  Filled 2015-05-04 (×3): qty 1

## 2015-05-04 MED ORDER — LEVOTHYROXINE SODIUM 100 MCG PO TABS: 100.0000 ug | ORAL_TABLET | Freq: Every day | ORAL | Status: DC

## 2015-05-04 MED ORDER — QUINAPRIL-HYDROCHLOROTHIAZIDE 20-12.5 MG PO TABS: 1.0000 | ORAL_TABLET | Freq: Every day | ORAL | Status: DC

## 2015-05-04 MED ORDER — LISINOPRIL 20 MG PO TABS
20.0000 mg | ORAL_TABLET | Freq: Every day | ORAL | Status: DC
  Administered 2015-05-04 – 2015-05-06 (×3): 20 mg via ORAL
  Filled 2015-05-04 (×4): qty 1

## 2015-05-04 MED ORDER — ACETAMINOPHEN 500 MG PO TABS
1000.0000 mg | ORAL_TABLET | Freq: Four times a day (QID) | ORAL | Status: AC
  Administered 2015-05-04: 1000 mg via ORAL
  Filled 2015-05-04: qty 2

## 2015-05-04 MED ORDER — BETHANECHOL CHLORIDE 25 MG PO TABS
25.0000 mg | ORAL_TABLET | Freq: Three times a day (TID) | ORAL | Status: DC
  Administered 2015-05-04 (×3): 25 mg via ORAL
  Filled 2015-05-04 (×6): qty 1

## 2015-05-04 MED ORDER — ENOXAPARIN SODIUM 40 MG/0.4ML ~~LOC~~ SOLN
40.0000 mg | SUBCUTANEOUS | Status: DC
Start: 2015-05-04 — End: 2015-05-05
  Administered 2015-05-04: 40 mg via SUBCUTANEOUS
  Filled 2015-05-04 (×2): qty 0.4

## 2015-05-04 MED ORDER — HYDROCHLOROTHIAZIDE 12.5 MG PO CAPS
12.5000 mg | ORAL_CAPSULE | Freq: Every day | ORAL | Status: DC
  Administered 2015-05-04 – 2015-05-06 (×3): 12.5 mg via ORAL
  Filled 2015-05-04 (×4): qty 1
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
## 2015-05-04 NOTE — Progress Notes (Signed)
Orthopedic Tech Progress Note Patient Details:  Autumn Benjamin May 21, 1941 [id number]  Ortho Devices Type of Ortho Device:  (prafo boot) Ortho Device/Splint Location: rle Ortho Device/Splint Interventions: Application   Eyan Hagood 05/04/2015, 8:25 AM
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
## 2015-05-04 NOTE — Progress Notes (Signed)
Pt unable to void after foley d/c and complaining of pain.  Bladder scan revealed  MD notified and orders given to place foley.  Foley placed and pt states relief.  Will continue to monitor.
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
## 2015-05-04 NOTE — Progress Notes (Signed)
Epidural Note: Epidural intact. Patient examined. Patient was comfortable without complaint. Talked to patient and nurse. Plan Continue present management. Judie Petit MD [id number]

## 2015-05-04 NOTE — Progress Notes (Signed)
Rehab Admissions Coordinator Note:  Patient was screened by Trish Mage for appropriateness for an Inpatient Acute Rehab Consult.  At this time, we are recommending Inpatient Rehab consult.  Trish Mage 05/04/2015, 3:58 PM  I can be reached at [phone].
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
## 2015-05-05 ENCOUNTER — Encounter (HOSPITAL_COMMUNITY): Payer: Self-pay | Admitting: Anesthesiology

## 2015-05-05 ENCOUNTER — Encounter (HOSPITAL_COMMUNITY): Payer: Self-pay | Admitting: Orthopedic Surgery

## 2015-05-05 DIAGNOSIS — S2243XS Multiple fractures of ribs, bilateral, sequela: Secondary | ICD-10-CM

## 2015-05-05 DIAGNOSIS — S72491A Other fracture of lower end of right femur, initial encounter for closed fracture: Secondary | ICD-10-CM

## 2015-05-05 DIAGNOSIS — R339 Retention of urine, unspecified: Secondary | ICD-10-CM | POA: Diagnosis not present

## 2015-05-05 LAB — CBC
HEMATOCRIT: 25.3 % — AB (ref 36.0–46.0)
HEMOGLOBIN: 8.2 g/dL — AB (ref 12.0–15.0)
MCH: 27.9 pg (ref 26.0–34.0)
MCHC: 32.4 g/dL (ref 30.0–36.0)
MCV: 86.1 fL (ref 78.0–100.0)
Platelets: 129 10*3/uL — ABNORMAL LOW (ref 150–400)
RBC: 2.94 MIL/uL — ABNORMAL LOW (ref 3.87–5.11)
RDW: 15 % (ref 11.5–15.5)
WBC: 8.7 10*3/uL (ref 4.0–10.5)

## 2015-05-05 LAB — VITAMIN D 25 HYDROXY (VIT D DEFICIENCY, FRACTURES): VIT D 25 HYDROXY: 13.2 ng/mL — AB (ref 30.0–100.0)

## 2015-05-05 MED ORDER — HYDROMORPHONE HCL 2 MG PO TABS
1.0000 mg | ORAL_TABLET | ORAL | Status: DC | PRN
  Administered 2015-05-05: 2 mg via ORAL
  Administered 2015-05-05 – 2015-05-06 (×2): 4 mg via ORAL
  Filled 2015-05-05: qty 1
  Filled 2015-05-05 (×2): qty 2

## 2015-05-05 MED ORDER — TRAMADOL HCL 50 MG PO TABS
100.0000 mg | ORAL_TABLET | Freq: Four times a day (QID) | ORAL | Status: DC
  Administered 2015-05-05 – 2015-05-06 (×5): 100 mg via ORAL
  Filled 2015-05-05 (×5): qty 2

## 2015-05-05 MED ORDER — BETHANECHOL CHLORIDE 25 MG PO TABS
25.0000 mg | ORAL_TABLET | Freq: Four times a day (QID) | ORAL | Status: DC
  Administered 2015-05-05 – 2015-05-06 (×5): 25 mg via ORAL
  Filled 2015-05-05 (×6): qty 1

## 2015-05-05 NOTE — Progress Notes (Signed)
I met with pt at bedside to discuss a possible inpt rehab admission when medically ready. She prefers CIR rather than SNF. Noted consideration for a pacer. I will contact her son, Saralyn Pilar, to begin discussions. I will initiate insurance approval once epidural d/c'd and pt close to medically ready for admit to CIR. [phone]

## 2015-05-05 NOTE — Progress Notes (Signed)
Patient afebrile, vital signs stable.  Adequate analgesia. Site clean, dry, and intact. Patients questions answered.  Epidural dc'd. tip of catheter intact
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
## 2015-05-06 ENCOUNTER — Inpatient Hospital Stay (HOSPITAL_COMMUNITY)
Admission: RE | Admit: 2015-05-06 | Discharge: 2015-05-18 | DRG: 560 | Disposition: A | Discharge status: Other Acute Inpatient Hospital | Payer: Medicare Other | Source: Intra-hospital | Attending: Physical Medicine & Rehabilitation | Admitting: Physical Medicine & Rehabilitation

## 2015-05-06 DIAGNOSIS — R000 Tachycardia, unspecified: Secondary | ICD-10-CM | POA: Diagnosis not present

## 2015-05-06 DIAGNOSIS — E039 Hypothyroidism, unspecified: Secondary | ICD-10-CM | POA: Diagnosis present

## 2015-05-06 DIAGNOSIS — D62 Acute posthemorrhagic anemia: Secondary | ICD-10-CM | POA: Diagnosis present

## 2015-05-06 DIAGNOSIS — M25562 Pain in left knee: Secondary | ICD-10-CM | POA: Diagnosis present

## 2015-05-06 DIAGNOSIS — J95811 Postprocedural pneumothorax: Secondary | ICD-10-CM | POA: Diagnosis not present

## 2015-05-06 DIAGNOSIS — R0902 Hypoxemia: Secondary | ICD-10-CM | POA: Diagnosis present

## 2015-05-06 DIAGNOSIS — J9811 Atelectasis: Secondary | ICD-10-CM | POA: Diagnosis present

## 2015-05-06 DIAGNOSIS — J939 Pneumothorax, unspecified: Secondary | ICD-10-CM | POA: Diagnosis present

## 2015-05-06 DIAGNOSIS — I10 Essential (primary) hypertension: Secondary | ICD-10-CM | POA: Diagnosis present

## 2015-05-06 DIAGNOSIS — R55 Syncope and collapse: Secondary | ICD-10-CM

## 2015-05-06 DIAGNOSIS — S270XXS Traumatic pneumothorax, sequela: Secondary | ICD-10-CM | POA: Diagnosis not present

## 2015-05-06 DIAGNOSIS — R339 Retention of urine, unspecified: Secondary | ICD-10-CM | POA: Diagnosis present

## 2015-05-06 DIAGNOSIS — Y838 Other surgical procedures as the cause of abnormal reaction of the patient, or of later complication, without mention of misadventure at the time of the procedure: Secondary | ICD-10-CM | POA: Diagnosis not present

## 2015-05-06 DIAGNOSIS — N390 Urinary tract infection, site not specified: Secondary | ICD-10-CM | POA: Diagnosis not present

## 2015-05-06 DIAGNOSIS — I441 Atrioventricular block, second degree: Secondary | ICD-10-CM | POA: Diagnosis present

## 2015-05-06 DIAGNOSIS — M9711XS 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, sequela: Secondary | ICD-10-CM

## 2015-05-06 DIAGNOSIS — K589 Irritable bowel syndrome without diarrhea: Secondary | ICD-10-CM | POA: Diagnosis present

## 2015-05-06 DIAGNOSIS — S270XXA Traumatic pneumothorax, initial encounter: Secondary | ICD-10-CM | POA: Diagnosis present

## 2015-05-06 DIAGNOSIS — M979XXD Periprosthetic fracture around unspecified internal prosthetic joint, subsequent encounter: Secondary | ICD-10-CM | POA: Diagnosis not present

## 2015-05-06 DIAGNOSIS — R52 Pain, unspecified: Secondary | ICD-10-CM | POA: Diagnosis not present

## 2015-05-06 DIAGNOSIS — M79604 Pain in right leg: Secondary | ICD-10-CM | POA: Diagnosis present

## 2015-05-06 DIAGNOSIS — Z950 Presence of cardiac pacemaker: Secondary | ICD-10-CM

## 2015-05-06 DIAGNOSIS — S2220XD Unspecified fracture of sternum, subsequent encounter for fracture with routine healing: Secondary | ICD-10-CM | POA: Diagnosis present

## 2015-05-06 DIAGNOSIS — S270XXD Traumatic pneumothorax, subsequent encounter: Secondary | ICD-10-CM | POA: Diagnosis not present

## 2015-05-06 DIAGNOSIS — S2220XS Unspecified fracture of sternum, sequela: Secondary | ICD-10-CM | POA: Diagnosis not present

## 2015-05-06 DIAGNOSIS — I442 Atrioventricular block, complete: Secondary | ICD-10-CM | POA: Insufficient documentation

## 2015-05-06 DIAGNOSIS — M9711XA 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, initial encounter: Secondary | ICD-10-CM

## 2015-05-06 DIAGNOSIS — T8189XA Other complications of procedures, not elsewhere classified, initial encounter: Secondary | ICD-10-CM | POA: Diagnosis not present

## 2015-05-06 DIAGNOSIS — S2220XA Unspecified fracture of sternum, initial encounter for closed fracture: Secondary | ICD-10-CM | POA: Diagnosis present

## 2015-05-06 DIAGNOSIS — S272XXS Traumatic hemopneumothorax, sequela: Secondary | ICD-10-CM

## 2015-05-06 LAB — TSH: TSH: 9.585 u[IU]/mL — AB (ref 0.350–4.500)

## 2015-05-06 MED ORDER — LISINOPRIL 20 MG PO TABS
20.0000 mg | ORAL_TABLET | Freq: Every day | ORAL | Status: DC
  Administered 2015-05-07 – 2015-05-18 (×12): 20 mg via ORAL
  Filled 2015-05-06 (×12): qty 1

## 2015-05-06 MED ORDER — TRAMADOL HCL 50 MG PO TABS
100.0000 mg | ORAL_TABLET | Freq: Four times a day (QID) | ORAL | Status: DC
  Administered 2015-05-06 – 2015-05-18 (×45): 100 mg via ORAL
  Filled 2015-05-06 (×46): qty 2

## 2015-05-06 MED ORDER — POLYETHYLENE GLYCOL 3350 17 G PO PACK
17.0000 g | PACK | Freq: Every day | ORAL | Status: DC
  Filled 2015-05-06 (×5): qty 1

## 2015-05-06 MED ORDER — PANTOPRAZOLE SODIUM 40 MG IV SOLR: 40.0000 mg | Freq: Every day | INTRAVENOUS | Status: DC

## 2015-05-06 MED ORDER — VITAMIN D (ERGOCALCIFEROL) 1.25 MG (50000 UNIT) PO CAPS
50000.0000 [IU] | ORAL_CAPSULE | ORAL | Status: DC
  Administered 2015-05-06: 50000 [IU] via ORAL
  Filled 2015-05-06: qty 1

## 2015-05-06 MED ORDER — LEVOTHYROXINE SODIUM 50 MCG PO TABS
100.0000 ug | ORAL_TABLET | Freq: Every day | ORAL | Status: DC
  Administered 2015-05-07 – 2015-05-18 (×12): 100 ug via ORAL
  Filled 2015-05-06 (×12): qty 2

## 2015-05-06 MED ORDER — POLYETHYLENE GLYCOL 3350 17 G PO PACK
17.0000 g | PACK | Freq: Every day | ORAL | Status: DC
  Administered 2015-05-06: 17 g via ORAL
  Filled 2015-05-06: qty 1

## 2015-05-06 MED ORDER — VITAMIN D3 25 MCG (1000 UNIT) PO TABS
1000.0000 [IU] | ORAL_TABLET | Freq: Two times a day (BID) | ORAL | Status: DC
  Administered 2015-05-06: 1000 [IU] via ORAL
  Filled 2015-05-06 (×3): qty 1

## 2015-05-06 MED ORDER — ONDANSETRON HCL 4 MG/2ML IJ SOLN: 4.0000 mg | Freq: Four times a day (QID) | INTRAMUSCULAR | Status: DC | PRN

## 2015-05-06 MED ORDER — SORBITOL 70 % SOLN: 30.0000 mL | Freq: Every day | Status: DC | PRN

## 2015-05-06 MED ORDER — VITAMIN D (ERGOCALCIFEROL) 1.25 MG (50000 UNIT) PO CAPS
50000.0000 [IU] | ORAL_CAPSULE | ORAL | Status: DC
  Administered 2015-05-13: 50000 [IU] via ORAL
  Filled 2015-05-06: qty 1

## 2015-05-06 MED ORDER — ASPIRIN 81 MG PO CHEW
81.0000 mg | CHEWABLE_TABLET | Freq: Every day | ORAL | Status: DC
  Administered 2015-05-06 – 2015-05-18 (×13): 81 mg via ORAL
  Filled 2015-05-06 (×13): qty 1

## 2015-05-06 MED ORDER — HYDROCHLOROTHIAZIDE 12.5 MG PO CAPS
12.5000 mg | ORAL_CAPSULE | Freq: Every day | ORAL | Status: DC
  Administered 2015-05-07 – 2015-05-18 (×12): 12.5 mg via ORAL
  Filled 2015-05-06 (×12): qty 1

## 2015-05-06 MED ORDER — PANTOPRAZOLE SODIUM 40 MG PO TBEC
40.0000 mg | DELAYED_RELEASE_TABLET | Freq: Every day | ORAL | Status: DC
  Administered 2015-05-06 – 2015-05-17 (×12): 40 mg via ORAL
  Filled 2015-05-06 (×12): qty 1

## 2015-05-06 MED ORDER — VITAMIN D 1000 UNITS PO TABS
1000.0000 [IU] | ORAL_TABLET | Freq: Two times a day (BID) | ORAL | Status: DC
  Administered 2015-05-06 – 2015-05-18 (×24): 1000 [IU] via ORAL
  Filled 2015-05-06 (×25): qty 1

## 2015-05-06 MED ORDER — HYDROMORPHONE HCL 2 MG PO TABS
1.0000 mg | ORAL_TABLET | ORAL | Status: DC | PRN
  Administered 2015-05-06: 4 mg via ORAL
  Administered 2015-05-07: 2 mg via ORAL
  Administered 2015-05-07: 4 mg via ORAL
  Administered 2015-05-07: 2 mg via ORAL
  Administered 2015-05-08 – 2015-05-10 (×7): 4 mg via ORAL
  Administered 2015-05-10: 2 mg via ORAL
  Administered 2015-05-11 – 2015-05-13 (×3): 4 mg via ORAL
  Administered 2015-05-14: 2 mg via ORAL
  Administered 2015-05-14: 1 mg via ORAL
  Administered 2015-05-14: 4 mg via ORAL
  Administered 2015-05-14: 2 mg via ORAL
  Administered 2015-05-15 – 2015-05-16 (×5): 4 mg via ORAL
  Administered 2015-05-16: 2 mg via ORAL
  Administered 2015-05-16: 4 mg via ORAL
  Administered 2015-05-17 (×2): 2 mg via ORAL
  Administered 2015-05-17: 4 mg via ORAL
  Administered 2015-05-17: 1 mg via ORAL
  Administered 2015-05-18: 2 mg via ORAL
  Filled 2015-05-06: qty 2
  Filled 2015-05-06 (×3): qty 1
  Filled 2015-05-06 (×3): qty 2
  Filled 2015-05-06: qty 1
  Filled 2015-05-06: qty 2
  Filled 2015-05-06 (×4): qty 1
  Filled 2015-05-06 (×3): qty 2
  Filled 2015-05-06: qty 1
  Filled 2015-05-06 (×4): qty 2
  Filled 2015-05-06 (×2): qty 1
  Filled 2015-05-06: qty 2
  Filled 2015-05-06: qty 1
  Filled 2015-05-06 (×3): qty 2
  Filled 2015-05-06: qty 1
  Filled 2015-05-06 (×4): qty 2

## 2015-05-06 MED ORDER — BETHANECHOL CHLORIDE 25 MG PO TABS
25.0000 mg | ORAL_TABLET | Freq: Four times a day (QID) | ORAL | Status: DC
  Administered 2015-05-06 – 2015-05-07 (×5): 25 mg via ORAL
  Filled 2015-05-06 (×6): qty 1

## 2015-05-06 MED ORDER — ONDANSETRON HCL 4 MG PO TABS
4.0000 mg | ORAL_TABLET | Freq: Four times a day (QID) | ORAL | Status: DC | PRN
  Administered 2015-05-13: 4 mg via ORAL
  Filled 2015-05-06 (×3): qty 1

## 2015-05-06 NOTE — Care Management Important Message (Signed)
Important Message  Patient Details  Name: Autumn Benjamin MRN: [id number] Date of Birth: 05/03/1941   Medicare Important Message Given:  Yes    Allix Blomquist P Dekota Kirlin 05/06/2015, 2:23 PM
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
## 2015-05-06 NOTE — Progress Notes (Signed)
Patient admitted to Rehabilitation from [address] [place] room 8 to [facility] room 7. Patient given information packet, oriented to room, and all questions answered. Patient verbalized understanding. Will continue to monitor patient. [place]
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
## 2015-05-06 NOTE — Clinical Social Work Placement (Signed)
   CLINICAL SOCIAL WORK PLACEMENT  NOTE  Date:  05/06/2015  Patient Details  Name: Autumn Benjamin MRN: [id number] Date of Birth: [birth year]  Clinical Social Work is seeking post-discharge placement for this patient at the Skilled  Nursing Facility level of care (*CSW will initial, date and re-position this form in  chart as items are completed):  Yes   Patient/family provided with [facility] Clinical Social Work Department's list of facilities offering this level of care within the geographic area requested by the patient (or if unable, by the patient's family).  Yes   Patient/family informed of their freedom to choose among providers that offer the needed level of care, that participate in Medicare, Medicaid or managed care program needed by the patient, have an available bed and are willing to accept the patient.  Yes   Patient/family informed of [facility]'s ownership interest in [facility] and [facility], as well as of the fact that they are under no obligation to receive care at these facilities.  PASRR submitted to EDS on       PASRR number received on       Existing PASRR number confirmed on       FL2 transmitted to all facilities in geographic area requested by pt/family on 05/06/15     FL2 transmitted to all facilities within larger geographic area on       Patient informed that his/her managed care company has contracts with or will negotiate with certain facilities, including the following:            Patient/family informed of bed offers received.  Patient chooses bed at       Physician recommends and patient chooses bed at      Patient to be transferred to   on  .  Patient to be transferred to facility by       Patient family notified on   of transfer.  Name of family member notified:        PHYSICIAN Please prepare priority discharge summary, including medications, Please prepare prescriptions, Please sign FL2     Additional Comment:     _______________________________________________ Roddie Mc MSW, LCSW, [place], [phone]
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
## 2015-05-06 NOTE — Progress Notes (Signed)
Noted cardiology recommendations for pacer placement prior to dc from inpt rehab. I contacted Emina, Trauma PA to again request pacer placement prior to dc to rehab. Noted pt would have limitations to LUE after pacer, but pt would then receive therapy during her rehab admission with those limitations to prepare for dc home. Otherwise LUE limitations would be placed on pt 24 hrs before dc from rehab. Emina, PA will follow up with me today so I can plan admission timing pending insurance approval and cardiology final plans. I met with pt at bedside and she is aware. [phone]
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
## 2015-05-06 NOTE — Progress Notes (Signed)
I contacted Trauma PA to make aware of insurance approved an inpt rehab admission and that I have not heard from cardiology. Trauma felt admit pt to inpt rehab today and cardiology placement of pacer will have to be coordinated with the rehab team. Pt in agreement and I will arrange for admit today. [phone]
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
## 2015-05-06 NOTE — Progress Notes (Signed)
Discussed with Dr. Elberta Fortis, RN had called that rehab would like her PPM placed at the beginning of her rehab rather then later to avoid mobility restrictions later on, Dr. Elberta Fortis is concerned about her needs for LUE ROM early in her rehab process, we attempted to contact rehab coordinator to discuss further.  He states he can plan to do the PPM next week.  RN states no bradycardic events have been observed.  Autumn Dowse, PA-C   Autumn Benjamin continue to follow while at rehab.  Possible pacemaker next week.  Autumn Benjamin discuss with rehab team about immobility of left arm and how that relates to rehab.  [name], MD
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
## 2015-05-06 NOTE — Care Management Note (Signed)
Case Management Note  Patient Details  Name: Nekayla Heider MRN: [id number] Date of Birth: 11/30/1940  Subjective/Objective:  Pt has insurance approval for inpatient rehab admission.                    Action/Plan: Plan to admit pt to [facility] CIR later today.    Expected Discharge Date:    05/06/2015              Expected Discharge Plan:  IP Rehab Facility  In-House Referral:  Clinical Social Work  Discharge planning Services  CM Consult  Post Acute Care Choice:    Choice offered to:     DME Arranged:    DME Agency:     HH Arranged:    HH Agency:     Status of Service:  Completed, signed off  Medicare Important Message Given:  Yes Date Medicare IM Given:    Medicare IM give by:    Date Additional Medicare IM Given:    Additional Medicare Important Message give by:     If discussed at Long Length of Stay Meetings, dates discussed:    Additional Comments:  Quintella Baton, RN, BSN  Trauma/Neuro ICU Case Manager [phone]
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
## 2015-05-07 ENCOUNTER — Inpatient Hospital Stay (HOSPITAL_COMMUNITY): Payer: Medicare Other | Admitting: Physical Therapy

## 2015-05-07 ENCOUNTER — Encounter (HOSPITAL_COMMUNITY): Payer: Self-pay

## 2015-05-07 ENCOUNTER — Inpatient Hospital Stay (HOSPITAL_COMMUNITY): Payer: Medicare Other | Admitting: Occupational Therapy

## 2015-05-07 LAB — PTH, INTACT AND CALCIUM
Calcium, Total (PTH): 7.8 mg/dL — ABNORMAL LOW (ref 8.7–10.3)
PTH: 35 pg/mL (ref 15–65)

## 2015-05-07 MED ORDER — TROLAMINE SALICYLATE 10 % EX CREA
TOPICAL_CREAM | Freq: Two times a day (BID) | CUTANEOUS | Status: DC | PRN
  Filled 2015-05-07: qty 85

## 2015-05-07 MED ORDER — MUSCLE RUB 10-15 % EX CREA
TOPICAL_CREAM | Freq: Two times a day (BID) | CUTANEOUS | Status: DC | PRN
  Administered 2015-05-07: 11:00:00 via TOPICAL
  Administered 2015-05-08: 1 via TOPICAL
  Administered 2015-05-08 – 2015-05-14 (×5): via TOPICAL
  Filled 2015-05-07: qty 85

## 2015-05-07 MED ORDER — DICLOFENAC SODIUM 1 % TD GEL
2.0000 g | Freq: Four times a day (QID) | TRANSDERMAL | Status: DC
  Administered 2015-05-07 – 2015-05-17 (×22): 2 g via TOPICAL
  Filled 2015-05-07: qty 100
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
## 2015-05-07 NOTE — Progress Notes (Signed)
Nurse called to room about 1435. Patient sitting up on side of bed with therapist. Patient Rt chest tube site dressing saturated with light pink drainage. Dressing changed-new gauze and tape applied. Drainage a[ppears to come around sutures. Dr Wynn Banker notified. No new orders received.
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
## 2015-05-08 ENCOUNTER — Inpatient Hospital Stay (HOSPITAL_COMMUNITY): Payer: Medicare Other

## 2015-05-08 ENCOUNTER — Inpatient Hospital Stay (HOSPITAL_COMMUNITY): Payer: Medicare Other | Admitting: Physical Therapy

## 2015-05-08 DIAGNOSIS — R52 Pain, unspecified: Secondary | ICD-10-CM

## 2015-05-08 LAB — VITAMIN D 1,25 DIHYDROXY
VITAMIN D 1, 25 (OH) TOTAL: 61 pg/mL
Vitamin D2 1, 25 (OH)2: <10 pg/mL
Vitamin D3 1, 25 (OH)2: 61 pg/mL

## 2015-05-08 MED ORDER — BETHANECHOL CHLORIDE 10 MG PO TABS
10.0000 mg | ORAL_TABLET | Freq: Three times a day (TID) | ORAL | Status: DC
  Administered 2015-05-08 – 2015-05-12 (×12): 10 mg via ORAL
  Filled 2015-05-08 (×13): qty 1

## 2015-05-08 MED ORDER — SENNOSIDES-DOCUSATE SODIUM 8.6-50 MG PO TABS
2.0000 | ORAL_TABLET | Freq: Two times a day (BID) | ORAL | Status: DC
  Administered 2015-05-08 – 2015-05-17 (×9): 2 via ORAL
  Filled 2015-05-08 (×17): qty 2
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
## 2015-05-08 NOTE — Progress Notes (Signed)
VASCULAR LAB PRELIMINARY  PRELIMINARY  PRELIMINARY  PRELIMINARY  Bilateral lower extremity venous duplex completed.    Preliminary report:  There is no DVT or SVT noted in the bilateral lower extremities.   Kleo Dungee, RVT 05/08/2015, 4:36 PM

## 2015-05-08 NOTE — Plan of Care (Signed)
Problem: RH BOWEL ELIMINATION Goal: RH STG MANAGE BOWEL WITH ASSISTANCE STG Manage Bowel with maximum Assistance.  Outcome: Not Progressing Senna started today  Problem: RH SKIN INTEGRITY Goal: RH STG ABLE TO PERFORM INCISION/WOUND CARE W/ASSISTANCE STG Able To Perform Incision/Wound Care With Maximum Assistance.  Outcome: Not Progressing Total assist for Rt chest dressing at this time

## 2015-05-08 NOTE — Plan of Care (Signed)
Problem: RH BLADDER ELIMINATION Goal: RH STG MANAGE BLADDER WITH ASSISTANCE STG Manage Bladder With maximum Assistance  Outcome: Not Progressing Incont.requires total assist

## 2015-05-08 NOTE — Plan of Care (Signed)
Problem: RH PAIN MANAGEMENT Goal: RH STG PAIN MANAGED AT OR BELOW PT'S PAIN GOAL Pain scale 4/10  Outcome: Progressing Continues to have a lot of pain.

## 2015-05-08 NOTE — Progress Notes (Signed)
Physical Therapy Session Note  Patient Details  Name: Autumn Benjamin MRN: [id number] Date of Birth: May 19, 1941  Today's Date: 05/08/2015 PT Individual Time: 0800-0900 PT Individual Time Calculation (min): 60 min   Short Term Goals: Week 1:  PT Short Term Goal 1 (Week 1): Pt will perform bed mobility with Mod A of 1 PT Short Term Goal 2 (Week 1): Pt will perform transfers with Max A of 1 with LRAD PT Short Term Goal 3 (Week 1): Pt will propel w/c 25' with Mod A  Skilled Therapeutic Interventions/Progress Updates:   Pt demonstrates improvement needing less assist with transfer compared to eval. Pt does benefit from slideboard and cues for hand placement during transfers. Pt would continue to benefit from skilled PT services to increase functional mobility.  Therapy Documentation Precautions:  Precautions Precautions: Fall Precaution Comments: epidural dc'd 11/10 Required Braces or Orthoses: Other Brace/Splint Restrictions Weight Bearing Restrictions: Yes RLE Weight Bearing: Non weight bearing Vital Signs: Therapy Vitals Temp: 98.3 F (36.8 C) Temp Source: Oral Pulse Rate: 87 Resp: 18 BP: (!) 137/58 mmHg Patient Position (if appropriate): Lying Oxygen Therapy SpO2: 98 % O2 Device: Nasal Cannula Pain: Pain Assessment Pain Assessment: 0-10 Pain Score: 5  Pain Location: Back Pain Orientation: Upper Pain Descriptors / Indicators: Aching Pain Onset: On-going Pain Intervention(s): Rest (medicated by previous RN) Mobility:  Max A transfers with slideboard with cues for technique and hand placement Other Treatments:  Pt educated on rehab plan, safety in mobility, progressing mobility, and rehab goals. Pt performs ankle pumps, hip abd AROM, glute sets 2x10. Pt performs seated anterior weight shifts 2x10 followed by 5 partial glute clearances. Transfers x3 in session. Pt positioned in neutral spine alignment at end of session with needs.  See Function Navigator for Current  Functional Status.   Therapy/Group: Individual Therapy  Christia Reading 05/08/2015, 8:57 AM

## 2015-05-08 NOTE — Plan of Care (Signed)
Problem: RH BLADDER ELIMINATION Goal: RH STG MANAGE BLADDER WITH EQUIPMENT WITH ASSISTANCE STG Manage Bladder With Equipment With max assistance  Outcome: Not Progressing Incont. Requires total assist

## 2015-05-08 NOTE — Progress Notes (Signed)
Nurse called to room by nurse tech. Patient was being put back to bed and Rt chest tube site draining through dressing. MD Kirsteins called to assess. New order received. Dressing changed with new 4x4 gauze and tape. No redness noted. Drainage serosanguineous, 2 sutures in place.

## 2015-05-09 ENCOUNTER — Inpatient Hospital Stay (HOSPITAL_COMMUNITY): Payer: Medicare Other | Admitting: Occupational Therapy

## 2015-05-09 ENCOUNTER — Inpatient Hospital Stay (HOSPITAL_COMMUNITY): Payer: Medicare Other | Admitting: Physical Therapy

## 2015-05-09 ENCOUNTER — Inpatient Hospital Stay (HOSPITAL_COMMUNITY): Payer: Medicare Other

## 2015-05-09 DIAGNOSIS — E876 Hypokalemia: Secondary | ICD-10-CM

## 2015-05-09 LAB — CBC WITH DIFFERENTIAL/PLATELET
Basophils Absolute: 0 10*3/uL (ref 0.0–0.1)
Basophils Relative: 0 %
EOS ABS: 0.2 10*3/uL (ref 0.0–0.7)
EOS PCT: 2 %
HCT: 29.5 % — ABNORMAL LOW (ref 36.0–46.0)
Hemoglobin: 9.4 g/dL — ABNORMAL LOW (ref 12.0–15.0)
LYMPHS ABS: 1.1 10*3/uL (ref 0.7–4.0)
LYMPHS PCT: 12 %
MCH: 28.6 pg (ref 26.0–34.0)
MCHC: 31.9 g/dL (ref 30.0–36.0)
MCV: 89.7 fL (ref 78.0–100.0)
MONO ABS: 1 10*3/uL (ref 0.1–1.0)
Monocytes Relative: 11 %
Neutro Abs: 6.7 10*3/uL (ref 1.7–7.7)
Neutrophils Relative %: 75 %
PLATELETS: 282 10*3/uL (ref 150–400)
RBC: 3.29 MIL/uL — ABNORMAL LOW (ref 3.87–5.11)
RDW: 17.2 % — AB (ref 11.5–15.5)
WBC: 8.9 10*3/uL (ref 4.0–10.5)

## 2015-05-09 LAB — COMPREHENSIVE METABOLIC PANEL
ALT: 41 U/L (ref 14–54)
ANION GAP: 10 (ref 5–15)
AST: 40 U/L (ref 15–41)
Albumin: 2.9 g/dL — ABNORMAL LOW (ref 3.5–5.0)
Alkaline Phosphatase: 138 U/L — ABNORMAL HIGH (ref 38–126)
BUN: 7 mg/dL (ref 6–20)
CHLORIDE: 92 mmol/L — AB (ref 101–111)
CO2: 34 mmol/L — ABNORMAL HIGH (ref 22–32)
CREATININE: 0.55 mg/dL (ref 0.44–1.00)
Calcium: 8.5 mg/dL — ABNORMAL LOW (ref 8.9–10.3)
Glucose, Bld: 111 mg/dL — ABNORMAL HIGH (ref 65–99)
POTASSIUM: 2.8 mmol/L — AB (ref 3.5–5.1)
Sodium: 136 mmol/L (ref 135–145)
Total Bilirubin: 1.2 mg/dL (ref 0.3–1.2)
Total Protein: 5.4 g/dL — ABNORMAL LOW (ref 6.5–8.1)

## 2015-05-09 MED ORDER — BISACODYL 10 MG RE SUPP
10.0000 mg | Freq: Every day | RECTAL | Status: DC | PRN
  Administered 2015-05-09: 10 mg via RECTAL
  Filled 2015-05-09: qty 1

## 2015-05-09 MED ORDER — POTASSIUM CHLORIDE CRYS ER 20 MEQ PO TBCR
40.0000 meq | EXTENDED_RELEASE_TABLET | Freq: Three times a day (TID) | ORAL | Status: DC
  Administered 2015-05-09 – 2015-05-10 (×4): 40 meq via ORAL
  Filled 2015-05-09 (×4): qty 2

## 2015-05-09 MED ORDER — LIDOCAINE 5 % EX PTCH
2.0000 | MEDICATED_PATCH | CUTANEOUS | Status: DC
  Administered 2015-05-09 – 2015-05-17 (×9): 2 via TRANSDERMAL
  Filled 2015-05-09 (×10): qty 2

## 2015-05-09 MED ORDER — FLEET ENEMA 7-19 GM/118ML RE ENEM: 1.0000 | ENEMA | Freq: Every day | RECTAL | Status: DC | PRN
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
## 2015-05-09 NOTE — Progress Notes (Signed)
Patient information reviewed and entered into eRehab system by Teodoro Jeffreys, RN, CRRN, PPS Coordinator.  Information including medical coding and functional independence measure will be reviewed and updated through discharge.     Per nursing patient was given "Data Collection Information Summary for Patients in Inpatient Rehabilitation Facilities with attached "Privacy Act Statement-Health Care Records" upon admission.  
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
## 2015-05-09 NOTE — Progress Notes (Signed)
Physical Therapy Session Note  Patient Details  Name: Autumn Benjamin MRN: [id number] Date of Birth: 05-13-1941  Today's Date: 05/09/2015 PT Individual Time: 0800-0900 PT Individual Time Calculation (min): 60 min   Short Term Goals: Week 1:  PT Short Term Goal 1 (Week 1): Pt will perform bed mobility with Mod A of 1 PT Short Term Goal 2 (Week 1): Pt will perform transfers with Max A of 1 with LRAD PT Short Term Goal 3 (Week 1): Pt will propel w/c 25' with Mod A  Skilled Therapeutic Interventions/Progress Updates:    Pt with increased pain and reports not sleeping well at all last night. Instructed in supine therex for functional strengthening and ROM with AAROM on RLE and AROM on L for ankle pumps x 20 reps x 2 sets, hip abduction x 10 reps x 2 sets, and heel slides x 10 reps x 2 sets each. Focused on functional bed mobility to get to EOB with extra time and min to mod assist for scooting and RLE management with cues for technique. Slideboard transfer with overall min assist from bed -> w/c with extra time, cues for technique and hand placement and to maintain NWB on RLE. Positioned in w/c with BLE elevated for comfort with pillows and all needs in reach awaiting OT. D/c planning discussed during rest breaks with emphasis on goals, conference, and determining home accessibility/set-up to prepare for safe discharge home.   Therapy Documentation Precautions:  Precautions Precautions: Fall Precaution Comments: epidural dc'd 11/10 Required Braces or Orthoses: Other Brace/Splint Restrictions Weight Bearing Restrictions: Yes RLE Weight Bearing: Non weight bearing  Pain: Reports pain in ribs and back is worse than RLE or L knee.  See Function Navigator for Current Functional Status.   Therapy/Group: Individual Therapy  Karolee Stamps Darrol Poke, PT, DPT  05/09/2015, 11:37 AM
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
## 2015-05-09 NOTE — Care Management Note (Signed)
[facility] Individual Statement of Services  Patient Name:  Yamilet Mcfayden  Date:  05/09/2015  Welcome [facility] the [facility].  Our goal is to provide you with an individualized program based on your diagnosis and situation, designed to meet your specific needs.  With this comprehensive rehabilitation program, you will be expected to participate in at least 3 hours of rehabilitation therapies Monday-Friday, with modified therapy programming on the weekends.  Your rehabilitation program will include the following services:  Physical Therapy (PT), Occupational Therapy (OT), 24 hour per day rehabilitation nursing, Therapeutic Recreaction (TR), Neuropsychology, Case Management (Social Worker), Rehabilitation Medicine, Nutrition Services and Pharmacy Services  Weekly team conferences will be held on Tuesdays to discuss your progress.  Your Social Worker will talk with you frequently to get your input and to update you on team discussions.  Team conferences with you and your family in attendance may also be held.  Expected length of stay: 14-21 days             Overall anticipated outcome: minimal assistance (w/c)  Depending on your progress and recovery, your program may change. Your Social Worker will coordinate services and will keep you informed of any changes. Your Social Worker's name and contact numbers are listed  below.  The following services may also be recommended but are not provided by the [facility]:   Driving Evaluations  Home Health Rehabiltiation Services  Outpatient Rehabilitation Services  Vocational Rehabilitation   Arrangements will be made to provide these services after discharge if needed.  Arrangements include referral to agencies that provide these services.  Your insurance has been verified to be:  [employer] Your primary doctor is:  Fransisca Kaufmann, PA-C  Pertinent information will be shared with your  doctor and your insurance company.  Social Worker:  [place], [place] [phone] or ([phone]   Information discussed with and copy given to patient by: Amada Jupiter, 05/09/2015, 3:37 PM

## 2015-05-09 NOTE — Progress Notes (Signed)
Physical Therapy Session Note  Patient Details  Name: Autumn Benjamin MRN: [id number] Date of Birth: March 01, 1941  Today's Date: 05/09/2015 PT Individual Time: 1102-1208 PT Individual Time Calculation (min): 66 min   Short Term Goals: Week 1:  PT Short Term Goal 1 (Week 1): Pt will perform bed mobility with Mod A of 1 PT Short Term Goal 2 (Week 1): Pt will perform transfers with Max A of 1 with LRAD PT Short Term Goal 3 (Week 1): Pt will propel w/c 25' with Mod A  Skilled Therapeutic Interventions/Progress Updates:    Pt received sitting in w/c, reporting increased discomfort and requesting back to bed at end of session.  Pt attempted w/c propulsion with BUEs, but pain increased in chest wall so PT propelled total assist.  PT adjusted RLE ELR for patient comfort and to free up LLE for w/c propulsion.  Sit<>stand with RW and mod assist for lifting assist and verbal cues for hand placement. Pt able to tolerate ~2 min of standing with verbal cues for upright posture, midline, and NWB on RLE. Pt noted to be incontinent of bladder with wet gown/brief/pad. Pt returned to room using BUEs and LLE to propel w/c x150' with supervision and verbal cues.  Stand/pivot back to chair, mod assist with therapist facilitating from the front.  Pt positioned in supine with steady assist and rolling R/L with supervision for placement of clean brief and pillows.  Pt positioned to comfort with call bell in reach and needs met.   Therapy Documentation Precautions:  Precautions Precautions: Fall Precaution Comments: epidural dc'd 11/10 Required Braces or Orthoses: Other Brace/Splint Restrictions Weight Bearing Restrictions: Yes RLE Weight Bearing: Non weight bearing Pain: Pain Assessment Pain Assessment: 0-10 Pain Score: 8  Pain Intervention(s): Emotional support;Repositioned   See Function Navigator for Current Functional Status.   Therapy/Group: Individual Therapy  Earnest Conroy Penven-Crew 05/09/2015, 12:58  PM

## 2015-05-10 ENCOUNTER — Inpatient Hospital Stay (HOSPITAL_COMMUNITY): Payer: Medicare Other | Admitting: Physical Therapy

## 2015-05-10 ENCOUNTER — Inpatient Hospital Stay (HOSPITAL_COMMUNITY): Payer: Self-pay

## 2015-05-10 LAB — BASIC METABOLIC PANEL
Anion gap: 7 (ref 5–15)
BUN: 9 mg/dL (ref 6–20)
CALCIUM: 8.6 mg/dL — AB (ref 8.9–10.3)
CO2: 33 mmol/L — ABNORMAL HIGH (ref 22–32)
CREATININE: 0.54 mg/dL (ref 0.44–1.00)
Chloride: 95 mmol/L — ABNORMAL LOW (ref 101–111)
Glucose, Bld: 102 mg/dL — ABNORMAL HIGH (ref 65–99)
Potassium: 3.6 mmol/L (ref 3.5–5.1)
SODIUM: 135 mmol/L (ref 135–145)

## 2015-05-10 MED ORDER — POTASSIUM CHLORIDE CRYS ER 20 MEQ PO TBCR
20.0000 meq | EXTENDED_RELEASE_TABLET | Freq: Two times a day (BID) | ORAL | Status: DC
  Administered 2015-05-10 – 2015-05-12 (×4): 20 meq via ORAL
  Filled 2015-05-10 (×4): qty 1

## 2015-05-10 NOTE — Progress Notes (Signed)
Physical Therapy Session Note  Patient Details  Name: Autumn Benjamin MRN: [id number] Date of Birth: 12-07-1940  Today's Date: 05/10/2015 PT Individual Time: 2712-9290 and 9030-1499 PT Individual Time Calculation (min): 58 min and 60 min   Short Term Goals: Week 1:  PT Short Term Goal 1 (Week 1): Pt will perform bed mobility with Mod A of 1 PT Short Term Goal 2 (Week 1): Pt will perform transfers with Max A of 1 with LRAD PT Short Term Goal 3 (Week 1): Pt will propel w/c 25' with Mod A  Skilled Therapeutic Interventions/Progress Updates:   Session 1: Pt received sitting in w/c, reporting that she didn't sleep well and pain 6/10 in ribs.  Session focused on activity tolerance, pain control, strengthening, ROM, and progression of functional transfers.  Pt propelled w/c x150' to therapy gym using BUEs and LLE with verbal cues for directions.  Stand/pivot transfer w/c>mat using RW with mod assist to rise and verbal cues for walker positioning during pivot, upright posture, and sequencing.  Pt positioned in semi-reclined position on mat with wedge and pillows positioned behind pt's back for comfort.  PT instructed pt in BLE therex for activity tolerance, ROM, and strengthening.  Pt performed x10 reps of AROM quad sets, glute sets, heel slides (AAROM on RLE), and hip abd/add to midline.  Pt transitioned from supine>sitting with min assist to manage RLE and performed stand/pivot with RW back to w/c with mod assist.  Pt demos flexed posture and requires max verbal cues for sequencing and walker positioning during transfer.  Pt propelled w/c back to room in same manner as above and performed stand pivot transfer w/c>bed without RW, therapist facilitating from the front with mod assist to rise.  Sit>supine with min assist for RLE and pt positioned to comfort with call bell in reach and needs met.   Session 2: Pt received asleep in bed, easily awakes to voice and agreeable to therapy.  Session focus on  simulated car transfer, d/c problem solving, activity tolerance, and pain control.  Pt transitioned from supine>sitting EOB with min assist for RLE management and stand/pivot to w/c with mod assist to rise and verbal cues for pivot and upright posture.  Pt propelled w/c x150' with supervision and PT propelled remaining distance to ortho gym.  PT instructed patient in car transfer with mod assist to rise and verbal cues for hand placement and sequencing.  Pt required increased time and verbal cues to scoot back into car to allow enough room for RLE to be placed into car with reduced knee flexion.  Pt returned to room in w/c total assist for pain control and energy conservation.  Pt performed stand pivot transfer from w/c>recliner with mod assist.  PT positioned pt to comfort in recliner with call bell in reach and needs met.    Therapy Documentation Precautions:  Precautions Precautions: Fall Precaution Comments: epidural dc'd 11/10 Required Braces or Orthoses: Other Brace/Splint Restrictions Weight Bearing Restrictions: Yes RLE Weight Bearing: Non weight bearing   Pain: Pain Assessment Pain Assessment: 0-10 Pain Score: 6  Pain Intervention(s): Emotional support;Repositioned   See Function Navigator for Current Functional Status.   Therapy/Group: Individual Therapy  Earnest Conroy Penven-Crew 05/10/2015, 12:45 PM

## 2015-05-10 NOTE — Progress Notes (Signed)
Occupational Therapy Session Note  Patient Details  Name: Autumn Benjamin MRN: [id number] Date of Birth: [birth year]  Today's Date: 05/10/2015 OT Individual Time: 8270-7867 OT Individual Time Calculation (min): 75 min    Short Term Goals: Week 1:  OT Short Term Goal 1 (Week 1): Pt will transfer to [facility] with mod A using squat pivot. OT Short Term Goal 2 (Week 1): Pt will be able to manage clothing up/down hips using lateral leans on bed or BSC. OT Short Term Goal 3 (Week 1): Pt will tolerate knee flexion of at least 45 degrees to allow her to transfer using sliding board with supervision. OT Short Term Goal 4 (Week 1): Pt will be able to bathe with mod A.  Skilled Therapeutic Interventions/Progress Updates:    Pt resting in bed upon arrival and agreeable to therapy.  Pt stated she did not have any clothing she could wear.  Pt stated she is very hot natured and the only clothing she had was flannel.  Pt performed stand pivot transfer bed->w/c with mod A (therapist resting foot under pt's RLE to prevent bearing weight). Pt incontinent of bladder.  Pt engaged in bathing tasks with sit<>stand at sink.  Pt requested use of toilet and performed stand pivot transfer to [facility] over toilet.  Pt remained in w/c with BLE elevated at end of session with all needs within reach.  Focus on activity tolerance, sit<>stand, standing balance, functional transfers, and safety awareness to increased independence with BADLs.  Therapy Documentation Precautions:  Precautions Precautions: Fall Precaution Comments: epidural dc'd 11/10 Required Braces or Orthoses: Other Brace/Splint Restrictions Weight Bearing Restrictions: Yes RLE Weight Bearing: Non weight bearing Pain: Pain Assessment Pain Score: 5 at rest and increased to 8/10 with activity Repositioned   See Function Navigator for Current Functional Status.   Therapy/Group: Individual Therapy  Rich Brave 05/10/2015, 9:34 AM
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
## 2015-05-11 ENCOUNTER — Inpatient Hospital Stay (HOSPITAL_COMMUNITY): Payer: Medicare Other

## 2015-05-11 ENCOUNTER — Inpatient Hospital Stay (HOSPITAL_COMMUNITY): Payer: Medicare Other | Admitting: Physical Therapy

## 2015-05-11 MED ORDER — FENTANYL 12 MCG/HR TD PT72
12.5000 ug | MEDICATED_PATCH | TRANSDERMAL | Status: DC
  Administered 2015-05-11: 12.5 ug via TRANSDERMAL
  Filled 2015-05-11: qty 1

## 2015-05-11 NOTE — Progress Notes (Signed)
Physical Therapy Session Note  Patient Details  Name: Autumn Benjamin MRN: [id number] Date of Birth: 1940-11-09  Today's Date: 05/11/2015 PT Individual Time: 1002-1059 and 7517-0017 PT Individual Time Calculation (min): 57 min and 16 min Missed Time: 14 min due to pain   Short Term Goals: Week 1:  PT Short Term Goal 1 (Week 1): Pt will perform bed mobility with Mod A of 1 PT Short Term Goal 2 (Week 1): Pt will perform transfers with Max A of 1 with LRAD PT Short Term Goal 3 (Week 1): Pt will propel w/c 25' with Mod A  Skilled Therapeutic Interventions/Progress Updates:   Session 1: Pt received resting in w/c and agreeable to therapy session.  Session focused on activity tolerance, positioning, and pain relief.  PT propelled pt to therapy gym in w/c.  Pt able to tolerate ~1 min of sitting forward in w/c without back support, reporting 10/10 pain in L rib cage.  Pt repositioned with back support and focus on tolerating RLE in dependent position; pt able to tolerate x20 min with RLE in dependent position for therex and patient education.  PT instructed patient in LLE therex x15 reps for heel/toe raises, LAQ, and seated hip flexion with 3" step beneath foot for support and improved LE positioning.  PT provided patient education during rest breaks on pain relief, positioning, out of bed tolerance, and overall goals of therapy.  Pt verbalized understanding.  PT instructed patient in w/c propulsion with BUEs x100' with pt noting significant increase in pain.  Return to therapy gym total assist and handoff to new PT for 11:00 session.    Session 2: Pt received resting in bed, reporting she'd just gotten back to bed about thirty minutes prior. Pt states it's the most comfortable she's been since yesterday.  Session focus on patient education, d/c planning and problem solving.  PT provided patient with handout for home measurements to determine feasibility of pt using w/c in the home.  Reviewed different  measurements and answered all questions to patient's satisfaction.  Pt left supine in bed with call bell in reach and needs met.  14 minutes missed treatment.    Therapy Documentation Precautions:  Precautions Precautions: Fall Precaution Comments: epidural dc'd 11/10 Required Braces or Orthoses: Other Brace/Splint Restrictions Weight Bearing Restrictions: Yes RLE Weight Bearing: Non weight bearing Pain: Pain Assessment Pain Assessment: 0-10 Pain Score: 3  Pain Type: Acute pain Pain Location: Rib cage Pain Orientation: Right;Left;Anterior;Posterior Pain Descriptors / Indicators: Aching;Sore Pain Frequency: Constant Pain Onset: On-going Patients Stated Pain Goal: 4 Pain Intervention(s): Medication (See eMAR)   See Function Navigator for Current Functional Status.   Therapy/Group: Individual Therapy  Earnest Conroy Penven-Crew 05/11/2015, 4:32 PM
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
## 2015-05-11 NOTE — Progress Notes (Signed)
Occupational Therapy Session Note  Patient Details  Name: Autumn Benjamin MRN: [id number] Date of Birth: [birth year]  Today's Date: 05/11/2015 OT Individual Time: 0900-1000 OT Individual Time Calculation (min): 60 min    Short Term Goals: Week 1:  OT Short Term Goal 1 (Week 1): Pt will transfer to [facility] with mod A using squat pivot. OT Short Term Goal 2 (Week 1): Pt will be able to manage clothing up/down hips using lateral leans on bed or BSC. OT Short Term Goal 3 (Week 1): Pt will tolerate knee flexion of at least 45 degrees to allow her to transfer using sliding board with supervision. OT Short Term Goal 4 (Week 1): Pt will be able to bathe with mod A.  Skilled Therapeutic Interventions/Progress Updates:    Pt resting in bed upon arrival and agreeable to participating in therapy.  Pt does not have any clothing but agreed to bathing at sink and donning clean hospital gown.  Pt required min A moving RLE to edge of bed in preparation for transfer to w/c.  Pt performed stand pivot transfer to w/c with mod A and assist maintaining NWB on RLE.  Pt performed sit<>stand at sink X 4 with min A and steady A while standing.  Pt required more than a reasonable amount of time to complete bathing tasks with multiple rest breaks secondary to increased pain with transitional movements.  Pt remained in w/c with BLE elevated with all needs within reach.  Focus on bed mobility, sit<>stand, sitting balance EOB, standing balance, transitional movements, activity tolerance, and safety awareness to increased independence with BADLs.  Therapy Documentation Precautions:  Precautions Precautions: Fall Precaution Comments: epidural dc'd 11/10 Required Braces or Orthoses: Other Brace/Splint Restrictions Weight Bearing Restrictions: Yes RLE Weight Bearing: Non weight bearing Pain: Pain Assessment Pain Score: 7   Repositioned; emotional support  See Function Navigator for Current Functional  Status.   Therapy/Group: Individual Therapy  Rich Brave 05/11/2015, 10:01 AM
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
## 2015-05-11 NOTE — Progress Notes (Signed)
Occupational Therapy Note  Patient Details  Name: Autumn Benjamin MRN: [id number] Date of Birth: 02-Feb-1941  Today's Date: 05/11/2015 OT Individual Time: 1330-1400 OT Individual Time Calculation (min): 30 min   Pt c/o 8/10 pain-RLE, ribcage, back RN aware, repositioned  Pt stated she had just returned to bed.  Pt stated she had remained in w/c too long, all throughout the morning and was in considerable pain.  Pt agreed to engaged in bed mobility and BUE therex at bed level.  Pt c/o increased pain with shoulder presses with 4# weight bar.  Instructed/educated patient on BUE therex and exercise schedule when resting in bed.  Pt verbalized understanding.     Lavone Neri [facility] 05/11/2015, 2:55 PM

## 2015-05-11 NOTE — Progress Notes (Signed)
Social Work Patient ID: Autumn Benjamin, female   DOB: 1941/03/14, 74 y.o.   MRN: [id number]   Have reviewed team conference with pt and son, Luisa Hart (via phone).  Both aware and agreeable with target d/c of 12/3.  Discussed goals of supervision/ min with possible upgrade if able.  At home, pt IS alone during the day so if we cannot reach a mod i w/c level, then may need to pursue SNF after CIR.  Will monitor progress and try to anticipate if this d/c plan needs to change as her insurance may not allow stay through to target date if plan is for SNF.  Pt and son understand this possibility.  Son very supportive and realistic about care needs.  Will continue to follow.  Taniah Reinecke, LCSW

## 2015-05-11 NOTE — Progress Notes (Signed)
Physical Therapy Session Note  Patient Details  Name: Autumn Benjamin MRN: [id number] Date of Birth: [birth year]  Today's Date: 05/11/2015 PT Individual Time: 8453-6468 PT Individual Time Calculation (min): 30 min   Short Term Goals: Week 1:  PT Short Term Goal 1 (Week 1): Pt will perform bed mobility with Mod A of 1 PT Short Term Goal 2 (Week 1): Pt will perform transfers with Max A of 1 with LRAD PT Short Term Goal 3 (Week 1): Pt will propel w/c 25' with Mod A Week 2:     Skilled Therapeutic Interventions/Progress Updates:  Pt in significant pain but participated to the best of her ability.  She would benefit from a lower w/c in order to use LLE as well as bil UEs to propel.  Pt performed in sitting 10 x 1 each L long arc knee ext with isometric hold at end range, L hip flexion; 10 x 2 L calf raise, R hip external rotation, L calf raise without wt bearing.  O2 sats 94% on room air. PT initiated instruction in pt directing care for her position in w/c regarding chest comfort, LLE lowered as much as possible, RLE in neutral hip rotation, and Theraband securing RLE on elevating leg rest with pillow under and towel roll as spacer under Theraband.  Pt returned to room and left with all needs within reach, sitting up in w/c.    Therapy Documentation Precautions:  Precautions Precautions: Fall Precaution Comments: epidural dc'd 11/10 Required Braces or Orthoses: Other Brace/Splint Restrictions Weight Bearing Restrictions: Yes RLE Weight Bearing: Non weight bearing Pain: Pain Assessment Pain Score: 10-Worst pain ever Pain Location: Rib cage (bil) Pain Intervention(s): Medication (See eMAR)      See Function Navigator for Current Functional Status.   Therapy/Group: Individual Therapy  Shealynn Saulnier 05/11/2015, 12:07 PM

## 2015-05-12 ENCOUNTER — Inpatient Hospital Stay (HOSPITAL_COMMUNITY): Payer: Medicare Other | Admitting: Physical Therapy

## 2015-05-12 ENCOUNTER — Inpatient Hospital Stay (HOSPITAL_COMMUNITY): Payer: Self-pay

## 2015-05-12 LAB — BASIC METABOLIC PANEL
Anion gap: 9 (ref 5–15)
BUN: 9 mg/dL (ref 6–20)
CALCIUM: 8.9 mg/dL (ref 8.9–10.3)
CO2: 29 mmol/L (ref 22–32)
Chloride: 97 mmol/L — ABNORMAL LOW (ref 101–111)
Creatinine, Ser: 0.53 mg/dL (ref 0.44–1.00)
GFR calc Af Amer: >60 mL/min (ref >60)
GLUCOSE: 112 mg/dL — AB (ref 65–99)
Potassium: 4.6 mmol/L (ref 3.5–5.1)
Sodium: 135 mmol/L (ref 135–145)

## 2015-05-12 LAB — URINALYSIS, ROUTINE W REFLEX MICROSCOPIC
Bilirubin Urine: NEGATIVE
GLUCOSE, UA: NEGATIVE mg/dL
HGB URINE DIPSTICK: NEGATIVE
KETONES UR: NEGATIVE mg/dL
Nitrite: NEGATIVE
PH: 7.5 (ref 5.0–8.0)
PROTEIN: NEGATIVE mg/dL
Specific Gravity, Urine: 1.011 (ref 1.005–1.030)

## 2015-05-12 LAB — URINE MICROSCOPIC-ADD ON

## 2015-05-12 MED ORDER — POTASSIUM CHLORIDE CRYS ER 20 MEQ PO TBCR
20.0000 meq | EXTENDED_RELEASE_TABLET | Freq: Every day | ORAL | Status: DC
  Administered 2015-05-13 – 2015-05-18 (×6): 20 meq via ORAL
  Filled 2015-05-12 (×6): qty 1
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
## 2015-05-12 NOTE — Progress Notes (Signed)
Occupational Therapy Session Note  Patient Details  Name: Autumn Benjamin MRN: [id number] Date of Birth: [birth year]  Today's Date: 05/12/2015 OT Individual Time: 0900-1000 OT Individual Time Calculation (min): 60 min    Short Term Goals: Week 1:  OT Short Term Goal 1 (Week 1): Pt will transfer to [facility] with mod A using squat pivot. OT Short Term Goal 2 (Week 1): Pt will be able to manage clothing up/down hips using lateral leans on bed or BSC. OT Short Term Goal 3 (Week 1): Pt will tolerate knee flexion of at least 45 degrees to allow her to transfer using sliding board with supervision. OT Short Term Goal 4 (Week 1): Pt will be able to bathe with mod A.  Skilled Therapeutic Interventions/Progress Updates:    Pt resting in bed upon arrival.  Pt engaged in bathing tasks with sit<>stand from w/c at sink.  Pt has no clothing and is wearing hospital gowns for comfort.  Pt required assistance moving RLE off edge of bed and mod A for squat pivot transfer to w/c.  Therapist's foot under pt's RLE to assure NWB and for pt's comfort.  Pt noted with increased pain at Rt knee when flexed and keeps RLE propped on trash can when bathing at sink.  Pt requires min A/mod A for sit stand and steady A when standing at sink.  Pt has history of bladder incontinence.  Pt noted with pain throughout posterior rib cage throughout session but was able to participate in therapy.  Pt requires more than a reasonable amount of time to complete tasks with multiple rest breaks secondary to increased pain with activity.  Pt currently performs squat pivot/stand pivot transfers and is not using sliding board.  Therapy Documentation Precautions:  Precautions Precautions: Fall Precaution Comments: epidural dc'd 11/10 Required Braces or Orthoses: Other Brace/Splint Restrictions Weight Bearing Restrictions: Yes RLE Weight Bearing: Non weight bearing Pain: Pain Assessment Pain Assessment: 0-10 Pain Score: 8  Pain Type: Acute  pain Pain Location: Back Pain Orientation: Upper Pain Descriptors / Indicators: Aching Pain Onset: On-going Pain Intervention(s): Repositioned;RN made aware ADL: ADL ADL Comments: Refer to functional navigator  See Function Navigator for Current Functional Status.   Therapy/Group: Individual Therapy  Rich Brave 05/12/2015, 10:05 AM

## 2015-05-12 NOTE — Progress Notes (Signed)
Physical Therapy Session Note  Patient Details  Name: Autumn Benjamin MRN: [id number] Date of Birth: 23-Jun-1941  Today's Date: 05/12/2015 PT Individual Time: 1504-1364 and 3837-7939 PT Individual Time Calculation (min): 64 min and 71 min   Short Term Goals: Week 1:  PT Short Term Goal 1 (Week 1): Pt will perform bed mobility with Mod A of 1 PT Short Term Goal 2 (Week 1): Pt will perform transfers with Max A of 1 with LRAD PT Short Term Goal 3 (Week 1): Pt will propel w/c 25' with Mod A  Skilled Therapeutic Interventions/Progress Updates:   Session 1: Pt received sitting in w/c, agreeable to therapy session.  Session focus on activity tolerance, LE strengthening/ROM, transfers, and pain control.  PT applied 2 ACE wraps (6") for support and minimal compression to ribs to reduce pain during mobility.  Pt transferred w/c>therapy mat with mod assist to rise for stand pivot.  Pt transitioned to semi-reclined position on therapy mat with wedge and pillows for back support with mod assist to manage BLEs.   PT instructed pt in deep, pursed lip breathing for rib cage mobilization, pain control, and calming.  Pt transitioned from supine>sitting edge of mat with mod assist to manage BLEs and PT positioned self behind pt with pillows for back support.  Pt able to tolerate sitting with intermittent back support x10 min for LE therex and trunk extension.  PT instructed patient in 15 10 reps of LAQ bilaterally (AROM) and 10 reps of trunk extension with 3 second hold to increase tolerance for activity without back support.  Pt transported back to room in w/c and positioned supine in bed (mod assist overall) with pillows behind back, under RLE, and underneath bilat UEs for support and comfort.  Call bell in reach and needs met.   Session 2: Pt received supine in bed, reporting increased discomfort since having to roll for bedpan, agreeable to try high back w/c.  Session focus on community level w/c mobility,  functional transfers, bed mobility, and activity tolerance.  Pt transitioned to sitting EOB with mod assist to lower BLEs and transitioned to w/c with mod assist to rise and pivot.  Pt propelled w/c over uneven and even surfaces 2x100' with noted increased discomfort in ribs with uneven surfaces.  PT provided patient education during rest break on d/c planning, increasing activity tolerance, pain, and goals of therapy.  Pt returned to room at end of session and performed sit<>stand x2 from w/c with pivot on second stand back to bed. Pt positioned in bed with min assist for management of RLE.  Pt positioned in supine with pillows under RLE, trunk, and BUEs for support and comfort.  Call bell in reach and needs met.   Therapy Documentation Precautions:  Precautions Precautions: Fall Precaution Comments: epidural dc'd 11/10 Required Braces or Orthoses: Other Brace/Splint Restrictions Weight Bearing Restrictions: Yes RLE Weight Bearing: Non weight bearing Pain: Pain Assessment Pain Assessment: 0-10 Pain Score: 8  Pain Type: Acute pain Pain Location: Rib cage Pain Orientation: Right;Left Pain Descriptors / Indicators: Aching Pain Onset: On-going Pain Intervention(s): Emotional support;Repositioned   See Function Navigator for Current Functional Status.   Therapy/Group: Individual Therapy  Jaxden Blyden E Penven-Crew 05/12/2015, 12:20 PM

## 2015-05-13 ENCOUNTER — Inpatient Hospital Stay (HOSPITAL_COMMUNITY): Payer: Medicare Other | Admitting: Occupational Therapy

## 2015-05-13 ENCOUNTER — Inpatient Hospital Stay (HOSPITAL_COMMUNITY): Payer: Self-pay | Admitting: Occupational Therapy

## 2015-05-13 ENCOUNTER — Inpatient Hospital Stay (HOSPITAL_COMMUNITY): Payer: Medicare Other | Admitting: Physical Therapy

## 2015-05-13 MED ORDER — FENTANYL 25 MCG/HR TD PT72
25.0000 ug | MEDICATED_PATCH | TRANSDERMAL | Status: DC
  Administered 2015-05-13 – 2015-05-16 (×2): 25 ug via TRANSDERMAL
  Filled 2015-05-13 (×2): qty 1

## 2015-05-13 NOTE — Progress Notes (Signed)
Occupational Therapy Session Note  Patient Details  Name: Autumn Benjamin MRN: [id number] Date of Birth: [birth year]  Today's Date: 05/13/2015 OT Individual Time:  -   1100-1200  1st session    Short Term Goals: Week 1:  OT Short Term Goal 1 (Week 1): Pt will transfer to [facility] with mod A using squat pivot. OT Short Term Goal 2 (Week 1): Pt will be able to manage clothing up/down hips using lateral leans on bed or BSC. OT Short Term Goal 3 (Week 1): Pt will tolerate knee flexion of at least 45 degrees to allow her to transfer using sliding board with supervision. OT Short Term Goal 4 (Week 1): Pt will be able to bathe with mod A.      Skilled Therapeutic Interventions/Progress Updates:  Pt resting in wc upon arrival. Pt engaged in bathing tasks with sit<>stand from w/c at sink. Pt has no clothing and is wearing hospital gowns.   Pt required positioning RLE off foot pedals when preparing to go from sit to stand. Therapist's foot under pt's RLE to assure NWB and for pt's comfort. Pt requires min A/mod A for sit stand and steady A when standing at sink. Pt has has poor bladder incontinence and has frequent leakage.  .Pt with increased time to complete tasks with multiple rest breaks secondary to increased pain with activity.Provided myotherapy to rhomboids during bathing for decreased pain relief.  Pt transferred to bed at end of session and left with all needs in reach.  .   Therapy Documentation Precautions:  Precautions Precautions: Fall Precaution Comments: epidural dc'd 11/10 Required Braces or Orthoses: Other Brace/Splint Restrictions Weight Bearing Restrictions: Yes RLE Weight Bearing: Non weight bearing      Pain: Pain Assessment Pain Assessment: 0-10 Pain Score:8/10 rhomboids area  1st session Pain Type: Acute pain Pain Location: Back Pain Orientation: across mid back Pain Descriptors / Indicators: Aching Pain Frequency: Constant Pain Onset: On-going Pain  Intervention(s): Medication (See eMAR) ADL: ADL ADL Comments: Refer to functional navigator Exercises:  Time:1330-1400  (30 min)  2nd session Pain:  5/10  Rhomboids 2nd session Individual session  Engaged in NMRE using green theraband and 4# exercise bar.  Chilton Si theraband):  Did adduction/abduction at waist level and then shoulder level,  hip/hip on right and left.  Did 4# bar exerciseswith forearm down, up, and shoulder raises.  Pt tolerated session with no increased pain.         See Function Navigator for Current Functional Status.   Therapy/Group: Individual Therapy  Humberto Seals 05/13/2015, 8:01 AM

## 2015-05-13 NOTE — Progress Notes (Signed)
Physical Therapy Session Note  Patient Details  Name: Autumn Benjamin MRN: [id number] Date of Birth: [birth year]  Today's Date: 05/13/2015 PT Individual Time: 0903-1005 and 1400-1453  PT Individual Time Calculation (min): 62 min and 53 min  Short Term Goals: Week 1:  PT Short Term Goal 1 (Week 1): Pt will perform bed mobility with Mod A of 1 PT Short Term Goal 2 (Week 1): Pt will perform transfers with Max A of 1 with LRAD PT Short Term Goal 3 (Week 1): Pt will propel w/c 25' with Mod A  Skilled Therapeutic Interventions/Progress Updates:   Session 1: Pt received supine in bed, reporting incontinence and requesting assist to change brief.  Pt able to bridge x3 for removal of soiled brief, peri-care (1/2 performed by patient, 1/2 by PT), and placement of clean brief.  Pt transitioned supine>sit with min assist for managing RLE, HOB elevated.  Pt performed stand/pivot transfer with mod assist from bed>w/c (PT replaced 16x16 chair with 18x16 with high back for comfort).  Pt propelled w/c to therapy gym and PT adjusted RLE ELR to comfort.  PT instructed patient in BUE therex with 3# hand weight x10 reps for biceps, brachioradialis, and brachialis, and x10 reps of seated glute set with 5 sec hold.  PT propelled pt back to room for energy conservation and positioned pt in w/c to comfort.  Encouraged pt to try and sit up x1 hr before OT session, rest between morning and afternoon sessions, and then sit up through the completion of afternoon therapy sessions.  Pt verbalized understanding and left with call bell in reach and needs met.   Session 2:  Pt received supine in bed, reports having just gotten comfortable but agreeable to therapy session.  Session focus on pain control, activity tolerance, and dynamic sitting balance.  Pt transfers from supine>sitting min assist for RLE management, and bed>w/c mod assist to lift for stand pivot.  PT propelled pt to therapy gym and pt transferred to therapy mat in same  manner as above.  PT positioned behind patient with pillows for trunk support and heating packs applied to mid-back on R and and L side for pain control.  Pt engaged in dynamic sitting task, reaching for clothespins and clipping to box on L side.  PT instructed patient in BLE LAQ x5 reps each side before patient reporting feeling nauseas and requesting to stop.  Pt transferred back to w/c and transported back to room.  Pt positioned supine in bed with pillows placed for comfort.  Illustration of pillow placement placed at [facility] for consistency between care givers.  Pt left supine with call bell in reach and needs met.   Therapy Documentation Precautions:  Precautions Precautions: Fall Precaution Comments: epidural dc'd 11/10 Required Braces or Orthoses: Other Brace/Splint Restrictions Weight Bearing Restrictions: Yes RLE Weight Bearing: Non weight bearing Pain: Pain Assessment Pain Assessment: 0-10 Pain Score: 4  Pain Location: Rib cage Pain Orientation: Left;Right Pain Intervention(s): Emotional support;Repositioned   See Function Navigator for Current Functional Status.   Therapy/Group: Individual Therapy  Earnest Conroy Penven-Crew 05/13/2015, 12:10 PM
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
## 2015-05-14 ENCOUNTER — Inpatient Hospital Stay (HOSPITAL_COMMUNITY): Payer: Medicare Other | Admitting: Occupational Therapy

## 2015-05-14 DIAGNOSIS — R000 Tachycardia, unspecified: Secondary | ICD-10-CM

## 2015-05-14 DIAGNOSIS — N3000 Acute cystitis without hematuria: Secondary | ICD-10-CM

## 2015-05-14 DIAGNOSIS — G8918 Other acute postprocedural pain: Secondary | ICD-10-CM

## 2015-05-14 MED ORDER — NITROFURANTOIN MONOHYD MACRO 100 MG PO CAPS
100.0000 mg | ORAL_CAPSULE | Freq: Two times a day (BID) | ORAL | Status: DC
  Administered 2015-05-14 – 2015-05-15 (×3): 100 mg via ORAL
  Filled 2015-05-14 (×5): qty 1

## 2015-05-14 MED ORDER — TRAZODONE HCL 50 MG PO TABS
50.0000 mg | ORAL_TABLET | Freq: Every evening | ORAL | Status: DC | PRN
  Administered 2015-05-14 – 2015-05-16 (×3): 50 mg via ORAL
  Filled 2015-05-14 (×3): qty 1

## 2015-05-14 NOTE — Progress Notes (Signed)
Occupational Therapy Session Note  Patient Details  Name: Autumn Benjamin MRN: [id number] Date of Birth: 20-Oct-1940  Today's Date: 05/14/2015 OT Individual Time:  -   0900-1000   (60 MIN)        Short Term Goals: Week 1:  OT Short Term Goal 1 (Week 1): Pt will transfer to [facility] with mod A using squat pivot. OT Short Term Goal 2 (Week 1): Pt will be able to manage clothing up/down hips using lateral leans on bed or BSC. OT Short Term Goal 3 (Week 1): Pt will tolerate knee flexion of at least 45 degrees to allow her to transfer using sliding board with supervision. OT Short Term Goal 4 (Week 1): Pt will be able to bathe with mod A. Week 2:     Skilled Therapeutic Interventions/Progress Updates:    Pt resting in wc bed upon arrival. Pt engaged in bathing tasks at EOB due to poor night's rest and right foot pain.  Pt. Went from supine to sit with assist with RLE lowering.   Activity for sit to stand during bathing and dressing.  Activity to transitional movements from bed to recliner. Activity for decreased tension in back.   Pt. Left in recliner with all needs in reach.    Therapy Documentation Precautions:  Precautions Precautions: Fall Precaution Comments: epidural dc'd 11/10 Required Braces or Orthoses: Other Brace/Splint Restrictions Weight Bearing Restrictions: Yes RLE Weight Bearing: Non weight bearing General:     Pain: Pain Assessment Pain Assessment: 0-10 Pain Score: 6  Pain Location: Back Pain Orientation: Upper Pain Descriptors / Indicators: Aching Pain Frequency: Constant Pain Onset: On-going Pain Intervention(s): Medication (See eMAR) ADL: ADL ADL Comments: Refer to functional navigator    :    See Function Navigator for Current Functional Status.   Therapy/Group: Individual Therapy  Humberto Seals 05/14/2015, 9:09 AM
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
## 2015-05-15 ENCOUNTER — Inpatient Hospital Stay (HOSPITAL_COMMUNITY): Payer: Medicare Other | Admitting: Occupational Therapy

## 2015-05-15 DIAGNOSIS — G5791 Unspecified mononeuropathy of right lower limb: Secondary | ICD-10-CM

## 2015-05-15 LAB — URINE CULTURE: Culture: >=100000

## 2015-05-15 MED ORDER — GABAPENTIN 100 MG PO CAPS: 100.0000 mg | ORAL_CAPSULE | Freq: Every day | ORAL | Status: DC

## 2015-05-15 MED ORDER — GABAPENTIN 100 MG PO CAPS
200.0000 mg | ORAL_CAPSULE | Freq: Every day | ORAL | Status: DC
  Administered 2015-05-15 – 2015-05-17 (×3): 200 mg via ORAL
  Filled 2015-05-15 (×3): qty 2

## 2015-05-15 MED ORDER — CEPHALEXIN 250 MG PO CAPS
500.0000 mg | ORAL_CAPSULE | Freq: Two times a day (BID) | ORAL | Status: DC
  Administered 2015-05-15 (×2): 500 mg via ORAL
  Filled 2015-05-15 (×3): qty 2
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
## 2015-05-15 NOTE — Progress Notes (Signed)
Occupational Therapy Session Note  Patient Details  Name: Autumn Benjamin MRN: [id number] Date of Birth: [birth year]  Today's Date: 05/15/2015 OT Individual Time:  -  0900-0945  (45 min)       Short Term Goals: Week 1:  OT Short Term Goal 1 (Week 1): Pt will transfer to [facility] with mod A using squat pivot. OT Short Term Goal 2 (Week 1): Pt will be able to manage clothing up/down hips using lateral leans on bed or BSC. OT Short Term Goal 3 (Week 1): Pt will tolerate knee flexion of at least 45 degrees to allow her to transfer using sliding board with supervision. OT Short Term Goal 4 (Week 1): Pt will be able to bathe with mod A.      Skilled Therapeutic Interventions/Progress Updates:    Reports poor night sleep, for past 3 nights.  Daughter visititing.  Asked OT to come back after next pt.  Returned.  Pt agreed to bathe EOB.  Pt went from supine to sit assist with RLE.  Sat EOB and performed bathing and dressing with hospital gown.  Performed sit to stand x2 for peri care.  Maintained static standing balance for 1.5 minutes before sitting down.  Ppt Went from stand to sit with mod assist; stand to sit with min assist kicking out RLE.  She returned to be to get some sleep at end of session.    Therapy Documentation Precautions:  Precautions Precautions: Fall Precaution Comments: epidural dc'd 11/10 Required Braces or Orthoses: Other Brace/Splint Restrictions Weight Bearing Restrictions: Yes RLE Weight Bearing: Non weight bearing       Pain: Pain Assessment Pain Assessment: 0-10 Pain Score: 5  Pain Type: Acute pain Pain Location: Back Pain Orientation: Upper Pain Descriptors / Indicators: Aching Pain Frequency: Constant Pain Onset: On-going Pain Intervention(s): Medication (See eMAR) Multiple Pain Sites: No ADL: ADL ADL Comments: Refer to functional navigator     See Function Navigator for Current Functional Status.   Therapy/Group: Individual Therapy  Autumn Benjamin 05/15/2015, 8:06 AM

## 2015-05-15 NOTE — Progress Notes (Signed)
Requesting med for sleep, paged Dr. Allena Katz. At 2215 PRN dilaudid 2mg 's and PRN trazodone given, without relief. At 2310 PRN  Muscle rub applied to upper back and scheduled ultram given. At 0430, PRN dilaudid 4mg 's given. Reports poor night sleep, for past 3 nights. Awake most of night. A

## 2015-05-16 ENCOUNTER — Inpatient Hospital Stay (HOSPITAL_COMMUNITY): Payer: Medicare Other | Admitting: Physical Therapy

## 2015-05-16 ENCOUNTER — Inpatient Hospital Stay (HOSPITAL_COMMUNITY): Payer: Medicare Other | Admitting: Occupational Therapy

## 2015-05-16 DIAGNOSIS — N390 Urinary tract infection, site not specified: Secondary | ICD-10-CM

## 2015-05-16 DIAGNOSIS — A499 Bacterial infection, unspecified: Secondary | ICD-10-CM

## 2015-05-16 MED ORDER — CEPHALEXIN 250 MG PO CAPS
500.0000 mg | ORAL_CAPSULE | Freq: Three times a day (TID) | ORAL | Status: DC
  Administered 2015-05-16 – 2015-05-18 (×6): 500 mg via ORAL
  Filled 2015-05-16 (×6): qty 2

## 2015-05-16 NOTE — Progress Notes (Signed)
Physical Therapy Weekly Progress Note  Patient Details  Name: Autumn Benjamin MRN: [id number] Date of Birth: 1940-12-30  Beginning of progress report period: May 07, 2015 End of progress report period: May 16, 2015  Today's Date: 05/16/2015 PT Individual Time: 1340-1425 PT Individual Time Calculation (min): 45 min   Patient has met 3 of 3 short term goals.  Pt continues to make progress towards long term goals but is limited in mobility by significant pain from rib and knee fractures.   Patient continues to demonstrate the following deficits: strength, activity tolerance, pain, and balance and therefore will continue to benefit from skilled PT intervention to enhance overall performance with activity tolerance, balance, postural control and coordination.  Patient progressing toward long term goals..  Continue plan of care.  PT Short Term Goals Week 2:  PT Short Term Goal 1 (Week 2): Pt will perform bed mobility with supervision.  PT Short Term Goal 2 (Week 2): Pt will perform bed<>w/c transfers with supervision.  PT Short Term Goal 3 (Week 2): Pt will tolerate sitting OOB x3 hours at a time PT Short Term Goal 4 (Week 2): Pt will propel w/c in community setting.   Skilled Therapeutic Interventions/Progress Updates:    Pt received supine in bed and reporting increased pain from having sat up in w/c earlier but agreeable to therapy session.  Pt transitioned from supine>sitting EOB with min assist for RLE management and lateral scoot EOB>w/c with min assist for RLE management and verbal cues for sequencing.  Pt propelled w/c x150' to therapy gym mod I and fitted with w/c gloves for easier propulsion using BUEs.  PT instructed patient in LLE therex for strengthening and activity tolerance with 2# ankle weight. Pt performed 10 reps LAQ, seated hip flexion, heel/toe raises, and glute sets with 3 sec hold in sitting. Pt propelled w/c back to room and performed lateral scoot back to bed with  min assist for RLE and verbal cues for hand placement, use of LLE to push through during scoot, and forward weight shift. Pt positioned in supine with pillows placed for comfort with call bell in reach and needs met.   Therapy Documentation Precautions:  Precautions Precautions: Fall Precaution Comments: epidural dc'd 11/10 Required Braces or Orthoses: Other Brace/Splint Restrictions Weight Bearing Restrictions: Yes RLE Weight Bearing: Non weight bearing General: PT Amount of Missed Time (min): 15 Minutes PT Missed Treatment Reason: Pain Pain: Pain Assessment Pain Assessment: Faces Faces Pain Scale: Hurts whole lot Pain Intervention(s): Distraction;Emotional support;Heat applied   See Function Navigator for Current Functional Status.  Therapy/Group: Individual Therapy  Earnest Conroy Penven-Crew 05/16/2015, 3:14 PM
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
## 2015-05-16 NOTE — Progress Notes (Signed)
Occupational Therapy Weekly Progress Note  Patient Details  Name: Autumn Benjamin MRN: [id number] Date of Birth: 09-07-1940  Beginning of progress report period: May 07, 2015 End of progress report period: May 16, 2015  Today's Date: 05/16/2015 OT Individual Time: 2876-8115 and 1500-1530 OT Individual Time Calculation (min): 59 min and 30 min   Patient has met 3 of 4 short term goals. Pt making steady progress towards goals this week. Greatest barrier towards OT intervention has been pain. Pt currently requires Mod A for stand pivot transfer secondary to needing lifting assistance. Pt has increased anxiety/fear when utilizing RW in treatment. Pt has declined to don pull over shirt and pants this week and has been wearing hospital gown. Pt continues to benefit from OT intervention.   Patient continues to demonstrate the following deficits: decreased I in self care, decreased I in functional transfers/mobility, increased pain, deconditioned, decreased safety awareness,  and therefore will continue to benefit from skilled OT intervention to enhance overall performance with BADL.  Patient progressing toward long term goals..  Continue plan of care.  OT Short Term Goals Week 1:  OT Short Term Goal 1 (Week 1): Pt will transfer to [facility] with mod A using squat pivot. OT Short Term Goal 1 - Progress (Week 1): Met OT Short Term Goal 2 (Week 1): Pt will be able to manage clothing up/down hips using lateral leans on bed or BSC. OT Short Term Goal 2 - Progress (Week 1): Progressing toward goal OT Short Term Goal 3 (Week 1): Pt will tolerate knee flexion of at least 45 degrees to allow her to transfer using sliding board with supervision. OT Short Term Goal 3 - Progress (Week 1): Met OT Short Term Goal 4 (Week 1): Pt will be able to bathe with mod A. OT Short Term Goal 4 - Progress (Week 1): Met Week 2:  OT Short Term Goal 1 (Week 2): Pt will perform lateral scoot transfer onto drop arm commode  chair with min A in order to decrease assist with functional transfers. OT Short Term Goal 2 (Week 2): Pt will perform LB dressing with mod A in order to increase I with self care.  OT Short Term Goal 3 (Week 2): Pt will perform toileting with steady assist in order to increase I in self care.   Skilled Therapeutic Interventions/Progress Updates:  Session 1:Session 1: Upon entering the room, pt supine in bed with 7/10 c/o pain in "ribs". Pt agreeable to OT intervention this session. Pt reporting need to toilet and required coaxing for transfer onto drop arm commode chair. Pt performed supine >sit with close supervision. Pt performed stand pivot transfer with mod A for lifting onto drop arm commode chair from EOB. Pt able to have successful BM but required assistance with clothing management and hygiene for thoroughness. Pt performed bathing tasks while seated on commode chair this session with mod A to wash B lower legs and buttocks. Pt declined to wear clothing from home and donned clean hospital gown instead. Pt standing with RW and min A for balance while therapist move BSC and pushed wheelchair behind pt. Pt able to tolerate standing for 1 minute before sitting down. Pt remained in wheelchair with B LEs elevated for comfort. Call bell and all needed items within reach upon exiting the room.   Session 2: Upon entering the room, pt supine in bed with 7/10 pain reported, "all over". Pt requesting assistance to reposition self in bed. Pt utilizing L LE and B  UEs with bed rails to pull self up in bed with min assist from therapist. Pt becomes very emotional and tearful in regards to discharge. Pt is concerned that she will not have assistance at home and may have to discharge to another facility for additional therapy. OT educated pt on current recommendations and educated pt on team conference that will occur tomorrow. Coping strategies discussed and pt is to make small goal for herself each day. Pt remained  in bed and SW notified of conversation. Call bell and all needed items within reach upon exiting the room.   Therapy Documentation Precautions:  Precautions Precautions: Fall Precaution Comments: epidural dc'd 11/10 Required Braces or Orthoses: Other Brace/Splint Restrictions Weight Bearing Restrictions: Yes RLE Weight Bearing: Non weight bearing General:   Vital Signs: Therapy Vitals Temp: 98.5 F (36.9 C) Temp Source: Oral Pulse Rate: (!) 108 Resp: 17 BP: (!) 93/54 mmHg Patient Position (if appropriate): Lying Oxygen Therapy SpO2: 97 % O2 Device: Not Delivered ADL: ADL ADL Comments: Refer to functional navigator  See Function Navigator for Current Functional Status.   Therapy/Group: Individual Therapy  Phineas Semen 05/16/2015, 1:49 PM

## 2015-05-16 NOTE — Plan of Care (Signed)
Problem: RH PAIN MANAGEMENT Goal: RH STG PAIN MANAGED AT OR BELOW PT'S PAIN GOAL Pain scale 4/10  Outcome: Not Progressing Consistently rating pain > 4.

## 2015-05-16 NOTE — Progress Notes (Signed)
C/O "burning" pain to right foot/ankle. Autumn Benjamin A

## 2015-05-16 NOTE — Progress Notes (Signed)
Social Work Patient ID: Autumn Benjamin, female   DOB: [birth year], 74 y.o.   MRN: [id number]   Alerted by OT that pt very tearful throughout her session and anxious about likelihood that she will need to d/c to SNF facility after CIR.   Upon entering room, pt tearful and admits that she had "...hoped I could stay here until I was walking.Marland KitchenMarland KitchenI thought insurance could be pushed back to cover until then..."  Discussed the process of setting a LOS for CIR and considering assistance that pt's have at home.  Pt will be alone for 8+ hours everyday and therapies are NOT optimistic that a mod i (even at w/c level) can be reached in a short, CIR LOS.  Discussed likelihood of SNF and attempted to explain and reassure her that she will received continued tx sessions at SNF.   Pt understands all of this information, however, simply states that she feels this is best location for her and is a little fearful of care in skilled facilities.  Provided encouragement to pt to try and focus on longer term recovery she will make and that SNF should not be a permanent place for her.  Will continue to follow for support.   Of note, she also states that she would like more confirmation as to plan for pace maker and how much longer she will need to be in the brace - will follow up with MD/ PA about this.  Deshanti Adcox, LCSW

## 2015-05-17 ENCOUNTER — Inpatient Hospital Stay (HOSPITAL_COMMUNITY): Payer: Medicare Other | Admitting: Occupational Therapy

## 2015-05-17 ENCOUNTER — Inpatient Hospital Stay (HOSPITAL_COMMUNITY): Payer: Medicare Other | Admitting: Physical Therapy
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
## 2015-05-17 NOTE — Progress Notes (Signed)
Received call that patient nearing time for discharge. Dr Gershon Crane notes reviewed.  Will tentatively plan for PPM implant tomorrow with Dr Graciela Husbands (put on schedule, NPO after midnight).  Dr Graciela Husbands to see patient in the morning to discuss pacemaker procedure.   Gypsy Balsam, NP 05/17/2015 9:47 AM

## 2015-05-17 NOTE — Discharge Summary (Signed)
Discharge summary job # [id number]

## 2015-05-17 NOTE — Progress Notes (Signed)
Occupational Therapy Session Note  Patient Details  Name: Autumn Benjamin MRN: [id number] Date of Birth: 1940/09/27  Today's Date: 05/17/2015 OT Individual Time: 2355-7322 OT Individual Time Calculation (min): 60 min    Short Term Goals: Week 2:  OT Short Term Goal 1 (Week 2): Pt will perform lateral scoot transfer onto drop arm commode chair with min A in order to decrease assist with functional transfers. OT Short Term Goal 2 (Week 2): Pt will perform LB dressing with mod A in order to increase I with self care.  OT Short Term Goal 3 (Week 2): Pt will perform toileting with steady assist in order to increase I in self care.   Skilled Therapeutic Interventions/Progress Updates:  Upon entering the room, pt seated in wheelchair. Pt reporting 7/10 pain in R knee during transfer but pain decreasing once transfer completed. OT introduced leg lifter this session and educated and demonstrated pt on its use for functional tasks needed to increase independence. Pt practiced lifting R LE for leg rest x 5 reps and needing both hands. Pt then practiced removing leg rest and slowly lowering foot towards floor with increase time. OT discussed steps for set up of lateral scoot transfer from wheelchair >bed with use of leg lifter to manage R LE. Pt then demonstrated steps with min verbal cues for technique, increased time, and steady assist while transferring. Pt utilizing B UEs to scoot self and then once in safe position moving the R LE with AE. Pt returned to sit >supine and positioned self in bed with supervision. Pt then able to verbalize what she could do better, what she could change, and step by step verbal directions for transfer. Pt supine in bed with call bell and all needed items within reach.   Therapy Documentation Precautions:  Precautions Precautions: Fall Precaution Comments: epidural dc'd 11/10 Required Braces or Orthoses: Other Brace/Splint Restrictions Weight Bearing Restrictions:  Yes RLE Weight Bearing: Non weight bearing   Pain: Pain Assessment Pain Assessment: 0-10 Pain Score: 7 Pain Intervention(s): Repositioned;Emotional support;Heat applied ADL: ADL ADL Comments: Refer to functional navigator  See Function Navigator for Current Functional Status.   Therapy/Group: Individual Therapy  Lowella Grip 05/17/2015, 11:22 AM

## 2015-05-17 NOTE — Progress Notes (Signed)
Physical Therapy Session Note  Patient Details  Name: Autumn Benjamin MRN: [id number] Date of Birth: [birth year]  Today's Date: 05/17/2015 PT Individual Time: 4580-9983 and 1306-1400 PT Individual Time Calculation (min): 58 min and 54 min   Short Term Goals: Week 2:  PT Short Term Goal 1 (Week 2): Pt will perform bed mobility with supervision.  PT Short Term Goal 2 (Week 2): Pt will perform bed<>w/c transfers with supervision.  PT Short Term Goal 3 (Week 2): Pt will tolerate sitting OOB x3 hours at a time PT Short Term Goal 4 (Week 2): Pt will propel w/c in community setting.   Skilled Therapeutic Interventions/Progress Updates:   Session 1: Pt received supine in bed and agreeable to therapy session.  Session focus on activity tolerance, functional transfers, strengthening, and w/c mobility.  Pt transitioned from supine>sitting EOB and performed lateral scoot from EOB>w/c with min assist to manage RLE.  PT propelled pt to therapy gym for energy conservation and pain management.  PT instructed patient in LLE therex x15 reps with 2# ankle weight heel/toe raises, LAQ, and seated marching and x15 reps with 2# ankle weight and level 2 theraband for hip abd/add and hamstring curls.  PT instructed patient in donning/doffing w/c leg rests with patient able to manage L leg rest with supervision and cues by end of session.  Pt continues to require assist for management of RLE and ELR on R side of w/c due to pain and weakness.  PT instructed patient in x10 reps of partial sit ups from slightly reclined w/c back to sitting upright for core strengthening and tolerance of upright position.  Pt propelled w/c x150' back to room and positioned to comfort in w/c.  Pt expressed some questions about d/c plan, home versus SNF.  PT explained that for PT pt would need to be able to perform bed mobility, transfers, and w/c mobility (including management of all w/c parts) mod I level if she were to go home without 24/7  supervision.  Pt verbalized understanding.  Pt left in sitting with call bell in reach and needs met.   Session 2: Pt received supine in bed and agreeable to therapy session.  Session focus on patient education, progression of independence with transfers using leg lifter, car transfer, and LE therex in bed.  Pt transitioned from supine>sitting EOB using leg lifter and supervision progressing to min assist for management of RLE.  Pt performed lateral scoot from EOB>w/c with min assist to manage RLE due to pain in medial R knee.  PT propelled pt to ortho gym in w/c for pain and time management.  Pt performed car transfer with min assist for balance during sit>stand and during pivot.  Pt returned to room at end of session and transitioned from w/c>bed with supervision and verbal cues for sequencing and use of leg lifter.  PT provided patient education for LE exercises pt can work on independently when resting in bed (glute sets, quad sets, and modified SLR with knee flexion and use of leg lifter for LLE strengthening and activity tolerance).  Pt able to demonstrate correct technique for all 3 exercises, performed 3 glute sets and quad sets and 5 modified SLR.  PT provided education on use of pain as guide for exercises and patient verbalized understanding.  PT also provided patient education on PT goals for d/c and what patient would need to be able to do if she were to go home without supervision.  Pt left supine in bed with  call bell in reach and needs met.  Ice applied to R knee for pain control.   Therapy Documentation Precautions:  Precautions Precautions: Fall Precaution Comments: epidural dc'd 11/10 Required Braces or Orthoses: Other Brace/Splint Restrictions Weight Bearing Restrictions: Yes RLE Weight Bearing: Non weight bearing Pain: Pain Assessment Pain Assessment: 0-10 Pain Score: 6  Pain Type: Acute pain Pain Location: Foot Pain Orientation: Right Pain Descriptors / Indicators:  Burning Pain Onset: On-going Pain Intervention(s): Repositioned;Emotional support;Heat applied   See Function Navigator for Current Functional Status.   Therapy/Group: Individual Therapy  Adisa Litt E Penven-Crew 05/17/2015, 10:03 AM
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
## 2015-05-17 NOTE — Plan of Care (Signed)
Problem: RH Balance Goal: LTG: Patient will maintain dynamic sitting balance (OT) LTG: Patient will maintain dynamic sitting balance with assistance during activities of daily living (OT)  Downgraded based on pt's current progress Goal: LTG Patient will maintain dynamic standing with ADLs (OT) LTG: Patient will maintain dynamic standing balance with assist during activities of daily living (OT)  Outcome: Not Applicable Date Met:  87/68/11 Not applicable at this time   Problem: RH Bathing Goal: LTG Patient will bathe with assist, cues/equipment (OT) LTG: Patient will bathe specified number of body parts with assist with/without cues using equipment (position) (OT)  Downgraded based on pt's current progress  Problem: RH Dressing Goal: LTG Patient will perform lower body dressing w/assist (OT) LTG: Patient will perform lower body dressing with assist, with/without cues in positioning using equipment (OT)  Downgraded based on pt's current progress and pain limitation  Problem: RH Toileting Goal: LTG Patient will perform toileting w/assist, cues/equip (OT) LTG: Patient will perform toiletiing (clothes management/hygiene) with assist, with/without cues using equipment (OT)  Based on patients current progress and pain limitation  Problem: RH Toilet Transfers Goal: LTG Patient will perform toilet transfers w/assist (OT) LTG: Patient will perform toilet transfers with assist, with/without cues using equipment (OT)  Downgraded secondary to pt current progress and pain limitation

## 2015-05-18 ENCOUNTER — Inpatient Hospital Stay (HOSPITAL_COMMUNITY): Payer: Self-pay | Admitting: Physical Therapy

## 2015-05-18 ENCOUNTER — Encounter (HOSPITAL_COMMUNITY)
Admission: RE | Disposition: A | Discharge status: Other Acute Inpatient Hospital | Payer: Self-pay | Source: Intra-hospital | Attending: Physical Medicine & Rehabilitation

## 2015-05-18 ENCOUNTER — Inpatient Hospital Stay (HOSPITAL_COMMUNITY): Payer: Medicare Other

## 2015-05-18 ENCOUNTER — Encounter (HOSPITAL_COMMUNITY): Payer: Self-pay | Admitting: Internal Medicine

## 2015-05-18 ENCOUNTER — Inpatient Hospital Stay (HOSPITAL_COMMUNITY)
Admit: 2015-05-18 | Discharge: 2015-05-24 | DRG: 243 | Disposition: A | Discharge status: Skilled Nursing Facility | Payer: Medicare Other | Source: Ambulatory Visit | Attending: Internal Medicine | Admitting: Internal Medicine

## 2015-05-18 ENCOUNTER — Inpatient Hospital Stay (HOSPITAL_COMMUNITY): Payer: Medicare Other | Admitting: Occupational Therapy

## 2015-05-18 ENCOUNTER — Ambulatory Visit (HOSPITAL_COMMUNITY): Admission: RE | Admit: 2015-05-18 | Payer: Medicare Other | Source: Ambulatory Visit | Admitting: Internal Medicine

## 2015-05-18 DIAGNOSIS — D649 Anemia, unspecified: Secondary | ICD-10-CM | POA: Diagnosis present

## 2015-05-18 DIAGNOSIS — S270XXS Traumatic pneumothorax, sequela: Secondary | ICD-10-CM | POA: Diagnosis not present

## 2015-05-18 DIAGNOSIS — Z91018 Allergy to other foods: Secondary | ICD-10-CM

## 2015-05-18 DIAGNOSIS — Z938 Other artificial opening status: Secondary | ICD-10-CM | POA: Insufficient documentation

## 2015-05-18 DIAGNOSIS — N390 Urinary tract infection, site not specified: Secondary | ICD-10-CM | POA: Diagnosis present

## 2015-05-18 DIAGNOSIS — Z91040 Latex allergy status: Secondary | ICD-10-CM | POA: Diagnosis not present

## 2015-05-18 DIAGNOSIS — R55 Syncope and collapse: Secondary | ICD-10-CM | POA: Diagnosis present

## 2015-05-18 DIAGNOSIS — M1990 Unspecified osteoarthritis, unspecified site: Secondary | ICD-10-CM | POA: Diagnosis present

## 2015-05-18 DIAGNOSIS — Z79899 Other long term (current) drug therapy: Secondary | ICD-10-CM | POA: Diagnosis not present

## 2015-05-18 DIAGNOSIS — R0902 Hypoxemia: Secondary | ICD-10-CM

## 2015-05-18 DIAGNOSIS — Z7982 Long term (current) use of aspirin: Secondary | ICD-10-CM

## 2015-05-18 DIAGNOSIS — J939 Pneumothorax, unspecified: Secondary | ICD-10-CM

## 2015-05-18 DIAGNOSIS — D62 Acute posthemorrhagic anemia: Secondary | ICD-10-CM | POA: Diagnosis not present

## 2015-05-18 DIAGNOSIS — E559 Vitamin D deficiency, unspecified: Secondary | ICD-10-CM | POA: Diagnosis present

## 2015-05-18 DIAGNOSIS — I442 Atrioventricular block, complete: Secondary | ICD-10-CM | POA: Diagnosis present

## 2015-05-18 DIAGNOSIS — S72401D Unspecified fracture of lower end of right femur, subsequent encounter for closed fracture with routine healing: Secondary | ICD-10-CM | POA: Diagnosis not present

## 2015-05-18 DIAGNOSIS — R079 Chest pain, unspecified: Secondary | ICD-10-CM

## 2015-05-18 DIAGNOSIS — I495 Sick sinus syndrome: Secondary | ICD-10-CM | POA: Diagnosis present

## 2015-05-18 DIAGNOSIS — J9811 Atelectasis: Secondary | ICD-10-CM | POA: Diagnosis not present

## 2015-05-18 DIAGNOSIS — M069 Rheumatoid arthritis, unspecified: Secondary | ICD-10-CM | POA: Diagnosis present

## 2015-05-18 DIAGNOSIS — K589 Irritable bowel syndrome without diarrhea: Secondary | ICD-10-CM | POA: Diagnosis present

## 2015-05-18 DIAGNOSIS — E039 Hypothyroidism, unspecified: Secondary | ICD-10-CM | POA: Diagnosis present

## 2015-05-18 DIAGNOSIS — Z888 Allergy status to other drugs, medicaments and biological substances status: Secondary | ICD-10-CM

## 2015-05-18 DIAGNOSIS — S2220XD Unspecified fracture of sternum, subsequent encounter for fracture with routine healing: Secondary | ICD-10-CM | POA: Diagnosis not present

## 2015-05-18 DIAGNOSIS — J9600 Acute respiratory failure, unspecified whether with hypoxia or hypercapnia: Secondary | ICD-10-CM

## 2015-05-18 DIAGNOSIS — R0782 Intercostal pain: Secondary | ICD-10-CM | POA: Diagnosis not present

## 2015-05-18 DIAGNOSIS — I10 Essential (primary) hypertension: Secondary | ICD-10-CM | POA: Diagnosis present

## 2015-05-18 DIAGNOSIS — M979XXD Periprosthetic fracture around unspecified internal prosthetic joint, subsequent encounter: Secondary | ICD-10-CM | POA: Diagnosis not present

## 2015-05-18 DIAGNOSIS — Z885 Allergy status to narcotic agent status: Secondary | ICD-10-CM | POA: Diagnosis not present

## 2015-05-18 DIAGNOSIS — J95811 Postprocedural pneumothorax: Secondary | ICD-10-CM | POA: Diagnosis present

## 2015-05-18 DIAGNOSIS — B9620 Unspecified Escherichia coli [E. coli] as the cause of diseases classified elsewhere: Secondary | ICD-10-CM | POA: Diagnosis present

## 2015-05-18 DIAGNOSIS — S2220XS Unspecified fracture of sternum, sequela: Secondary | ICD-10-CM | POA: Diagnosis not present

## 2015-05-18 DIAGNOSIS — M9711XS 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, sequela: Secondary | ICD-10-CM | POA: Diagnosis not present

## 2015-05-18 HISTORY — PX: EP IMPLANTABLE DEVICE: SHX172B

## 2015-05-18 SURGERY — PACEMAKER IMPLANT

## 2015-05-18 MED ORDER — CEFAZOLIN SODIUM-DEXTROSE 2-3 GM-% IV SOLR
2.0000 g | INTRAVENOUS | Status: DC
  Filled 2015-05-18: qty 50

## 2015-05-18 MED ORDER — SENNOSIDES-DOCUSATE SODIUM 8.6-50 MG PO TABS: 2.0000 | ORAL_TABLET | Freq: Two times a day (BID) | ORAL | Status: DC

## 2015-05-18 MED ORDER — MIDAZOLAM HCL 5 MG/5ML IJ SOLN
INTRAMUSCULAR | Status: AC
  Filled 2015-05-18: qty 5

## 2015-05-18 MED ORDER — TRAZODONE HCL 50 MG PO TABS: 50.0000 mg | ORAL_TABLET | Freq: Every evening | ORAL | Status: DC | PRN

## 2015-05-18 MED ORDER — ASPIRIN EC 81 MG PO TBEC: 81.0000 mg | DELAYED_RELEASE_TABLET | Freq: Every day | ORAL | Status: DC

## 2015-05-18 MED ORDER — SODIUM CHLORIDE 0.9 % IR SOLN
Status: AC
  Filled 2015-05-18: qty 2

## 2015-05-18 MED ORDER — FENTANYL 25 MCG/HR TD PT72
25.0000 ug | MEDICATED_PATCH | TRANSDERMAL | Status: DC
  Filled 2015-05-18: qty 1

## 2015-05-18 MED ORDER — SODIUM CHLORIDE 0.9 % IR SOLN
80.0000 mg | Status: DC
  Filled 2015-05-18: qty 2

## 2015-05-18 MED ORDER — MIDAZOLAM HCL 5 MG/5ML IJ SOLN
INTRAMUSCULAR | Status: DC | PRN
  Administered 2015-05-18 (×3): 1 mg via INTRAVENOUS

## 2015-05-18 MED ORDER — IOHEXOL 350 MG/ML SOLN
INTRAVENOUS | Status: DC | PRN
  Administered 2015-05-18: 20 mL via INTRAVENOUS

## 2015-05-18 MED ORDER — DICLOFENAC SODIUM 1 % TD GEL
2.0000 g | Freq: Four times a day (QID) | TRANSDERMAL | Status: DC
  Filled 2015-05-18: qty 100

## 2015-05-18 MED ORDER — FENTANYL CITRATE (PF) 100 MCG/2ML IJ SOLN
INTRAMUSCULAR | Status: AC
  Filled 2015-05-18: qty 2

## 2015-05-18 MED ORDER — VITAMIN D (ERGOCALCIFEROL) 1.25 MG (50000 UNIT) PO CAPS: 50000.0000 [IU] | ORAL_CAPSULE | ORAL | Status: DC

## 2015-05-18 MED ORDER — PANTOPRAZOLE SODIUM 40 MG PO TBEC
40.0000 mg | DELAYED_RELEASE_TABLET | Freq: Every day | ORAL | Status: DC
  Administered 2015-05-18: 40 mg via ORAL
  Filled 2015-05-18: qty 1

## 2015-05-18 MED ORDER — LIDOCAINE 5 % EX PTCH
2.0000 | MEDICATED_PATCH | CUTANEOUS | Status: DC
  Administered 2015-05-18: 2 via TRANSDERMAL
  Filled 2015-05-18: qty 2

## 2015-05-18 MED ORDER — HEPARIN (PORCINE) IN NACL 2-0.9 UNIT/ML-% IJ SOLN
INTRAMUSCULAR | Status: DC | PRN
  Administered 2015-05-18: 12:00:00

## 2015-05-18 MED ORDER — GABAPENTIN 100 MG PO CAPS
200.0000 mg | ORAL_CAPSULE | Freq: Every day | ORAL | Status: DC
  Administered 2015-05-18: 200 mg via ORAL
  Filled 2015-05-18: qty 2

## 2015-05-18 MED ORDER — CEPHALEXIN 250 MG PO CAPS
250.0000 mg | ORAL_CAPSULE | Freq: Three times a day (TID) | ORAL | Status: DC
  Administered 2015-05-18 (×2): 250 mg via ORAL
  Filled 2015-05-18 (×2): qty 1

## 2015-05-18 MED ORDER — LIDOCAINE HCL (PF) 1 % IJ SOLN
INTRAMUSCULAR | Status: AC
  Filled 2015-05-18: qty 60

## 2015-05-18 MED ORDER — VITAMIN D 1000 UNITS PO TABS
1000.0000 [IU] | ORAL_TABLET | Freq: Two times a day (BID) | ORAL | Status: DC
  Administered 2015-05-18: 1000 [IU] via ORAL
  Filled 2015-05-18: qty 1

## 2015-05-18 MED ORDER — FLEET ENEMA 7-19 GM/118ML RE ENEM: 1.0000 | ENEMA | Freq: Every day | RECTAL | Status: DC | PRN

## 2015-05-18 MED ORDER — TRAMADOL HCL 50 MG PO TABS
100.0000 mg | ORAL_TABLET | Freq: Four times a day (QID) | ORAL | Status: DC
  Administered 2015-05-18: 100 mg via ORAL
  Filled 2015-05-18: qty 2

## 2015-05-18 MED ORDER — LISINOPRIL 20 MG PO TABS: 20.0000 mg | ORAL_TABLET | Freq: Every day | ORAL | Status: DC

## 2015-05-18 MED ORDER — FENTANYL CITRATE (PF) 100 MCG/2ML IJ SOLN
100.0000 ug | Freq: Once | INTRAMUSCULAR | Status: AC
  Administered 2015-05-19 (×2): 50 ug via INTRAVENOUS
  Filled 2015-05-18: qty 2

## 2015-05-18 MED ORDER — SODIUM CHLORIDE 0.9 % IR SOLN
Status: DC | PRN
  Administered 2015-05-18: 13:00:00

## 2015-05-18 MED ORDER — LEVOTHYROXINE SODIUM 50 MCG PO TABS: 100.0000 ug | ORAL_TABLET | Freq: Every day | ORAL | Status: DC

## 2015-05-18 MED ORDER — CEFAZOLIN SODIUM-DEXTROSE 2-3 GM-% IV SOLR
INTRAVENOUS | Status: DC | PRN
  Administered 2015-05-18: 2 g via INTRAVENOUS

## 2015-05-18 MED ORDER — CEFAZOLIN SODIUM-DEXTROSE 2-3 GM-% IV SOLR
INTRAVENOUS | Status: AC
  Filled 2015-05-18: qty 50

## 2015-05-18 MED ORDER — HYDROCHLOROTHIAZIDE 12.5 MG PO CAPS: 12.5000 mg | ORAL_CAPSULE | Freq: Every day | ORAL | Status: DC

## 2015-05-18 MED ORDER — ENOXAPARIN SODIUM 40 MG/0.4ML ~~LOC~~ SOLN: 40.0000 mg | SUBCUTANEOUS | Status: DC

## 2015-05-18 MED ORDER — HEPARIN (PORCINE) IN NACL 2-0.9 UNIT/ML-% IJ SOLN
INTRAMUSCULAR | Status: AC
  Filled 2015-05-18: qty 1000

## 2015-05-18 MED ORDER — CHLORHEXIDINE GLUCONATE 4 % EX LIQD
60.0000 mL | Freq: Once | CUTANEOUS | Status: DC
  Filled 2015-05-18: qty 60

## 2015-05-18 MED ORDER — BISACODYL 10 MG RE SUPP: 10.0000 mg | Freq: Every day | RECTAL | Status: DC | PRN

## 2015-05-18 MED ORDER — FENTANYL CITRATE (PF) 100 MCG/2ML IJ SOLN
INTRAMUSCULAR | Status: DC | PRN
  Administered 2015-05-18 (×2): 25 ug via INTRAVENOUS

## 2015-05-18 MED ORDER — HYDROMORPHONE HCL 2 MG PO TABS
2.0000 mg | ORAL_TABLET | ORAL | Status: DC | PRN
  Administered 2015-05-18: 2 mg via ORAL
  Filled 2015-05-18: qty 1

## 2015-05-18 MED ORDER — MUSCLE RUB 10-15 % EX CREA
TOPICAL_CREAM | Freq: Two times a day (BID) | CUTANEOUS | Status: DC | PRN
  Filled 2015-05-18: qty 85

## 2015-05-18 MED ORDER — SODIUM CHLORIDE 0.9 % IV SOLN: INTRAVENOUS | Status: DC

## 2015-05-18 MED ORDER — LIDOCAINE HCL (PF) 1 % IJ SOLN
INTRAMUSCULAR | Status: DC | PRN
  Administered 2015-05-18: 31 mL via INTRADERMAL

## 2015-05-18 SURGICAL SUPPLY — 9 items
CABLE SURGICAL S-101-97-12 (CABLE) ×2 IMPLANT
HEMOSTAT SURGICEL 2X4 FIBR (HEMOSTASIS) ×2 IMPLANT
LEAD CAPSURE NOVUS 5076-52CM (Lead) ×2 IMPLANT
LEAD CAPSURE NOVUS 5076-58CM (Lead) ×2 IMPLANT
PAD DEFIB LIFELINK (PAD) ×2 IMPLANT
PPM ELUNA DR-T 394969 (Pacemaker) ×2 IMPLANT
SET INTRODUCER MICROPUNCT 5F (INTRODUCER) ×2 IMPLANT
SHEATH CLASSIC 7F (SHEATH) ×4 IMPLANT
TRAY PACEMAKER INSERTION (PACKS) ×2 IMPLANT

## 2015-05-18 NOTE — Progress Notes (Signed)
Occupational Therapy Session Note  Patient Details  Name: Autumn Benjamin MRN: [id number] Date of Birth: 09-17-1940  Today's Date: 05/18/2015 OT Individual Time: 2633-3545 OT Individual Time Calculation (min): 57 min    Short Term Goals: Week 2:  OT Short Term Goal 1 (Week 2): Pt will perform lateral scoot transfer onto drop arm commode chair with min A in order to decrease assist with functional transfers. OT Short Term Goal 2 (Week 2): Pt will perform LB dressing with mod A in order to increase I with self care.  OT Short Term Goal 3 (Week 2): Pt will perform toileting with steady assist in order to increase I in self care.   Skilled Therapeutic Interventions/Progress Updates:  Upon entering the room, pt supine in bed with 3/10 c/o pain in R knee this session. Pt agreeable to OT intervention although she remains tearful throughout session secondary to upcoming procedure. LB bathing completed in supine with pt rolling to the L <>R with min A and increased time secondary to pain. Pt able to wash peri area but requiring assistance for remainder of LB. OT and pt attempting to exit the bed and staff arrived to report she would be taken to surgery soon. Therefore, self care completed in bed. Call bell and all needed items within reach upon exiting the room. OT discussed with pt that upcoming procedure will most likely change how pt is able to transfer. Pt verbalized understanding.   Therapy Documentation Precautions:  Precautions Precautions: Fall Precaution Comments: epidural dc'd 11/10 Required Braces or Orthoses: Other Brace/Splint Restrictions Weight Bearing Restrictions: Yes RLE Weight Bearing: Non weight bearing General:   Vital Signs: Therapy Vitals Temp: 97.8 F (36.6 C) Temp Source: Oral Pulse Rate: (!) 105 Resp: 18 BP: 95/63 mmHg Patient Position (if appropriate): Lying Oxygen Therapy SpO2: 100 % O2 Device: Not Delivered Pain:   ADL: ADL ADL Comments: Refer to  functional navigator  See Function Navigator for Current Functional Status.   Therapy/Group: Individual Therapy  Lowella Grip 05/18/2015, 10:03 AM
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
## 2015-05-18 NOTE — Discharge Instructions (Signed)
Inpatient Rehab Discharge Instructions  Autumn Benjamin Discharge date and time: No discharge date for patient encounter.   Activities/Precautions/ Functional Status: Activity: Nonweightbearing right lower extremity Diet: regular diet Wound Care: keep wound clean and dry Functional status:  ___ No restrictions     ___ Walk up steps independently ___ 24/7 supervision/assistance   ___ Walk up steps with assistance ___ Intermittent supervision/assistance  ___ Bathe/dress independently ___ Walk with walker     ___ Bathe/dress with assistance ___ Walk Independently    ___ Shower independently _x__ Walk with assistance    ___ Shower with assistance ___ No alcohol     ___ Return to work/school ________  Special Instructions:    My questions have been answered and I understand these instructions. I will adhere to these goals and the provided educational materials after my discharge from the hospital.  Patient/Caregiver Signature _______________________________ Date __________  Clinician Signature _______________________________________ Date __________  Please bring this form and your medication list with you to all your follow-up doctor's appointments.      Supplemental Discharge Instructions for  Pacemaker/Defibrillator Patients  Activity No heavy lifting or vigorous activity with your left arm for 6 to 8 weeks.  Do not raise your left arm above your head for one week.  Gradually raise your affected arm as drawn below.            05/22/15                   05/23/15                  05/24/15                 05/25/15 __  NO DRIVING for 6 months post (syncope) fainting      WOUND CARE - Keep the wound area clean and dry.  Do not get this area wet for one week. No showers for one week; you may shower on   05/25/15  . - The tape/steri-strips on your wound will fall off; do not pull them off.  No bandage is needed on the site.  DO  NOT apply any creams, oils, or ointments to the wound  area. - If you notice any drainage or discharge from the wound, any swelling or bruising at the site, or you develop a fever > 101? F after you are discharged home, call the office at once.  Special Instructions - You are still able to use cellular telephones; use the ear opposite the side where you have your pacemaker/defibrillator.  Avoid carrying your cellular phone near your device. - When traveling through airports, show security personnel your identification card to avoid being screened in the metal detectors.  Ask the security personnel to use the hand wand. - Avoid arc welding equipment, MRI testing (magnetic resonance imaging), TENS units (transcutaneous nerve stimulators).  Call the office for questions about other devices. - Avoid electrical appliances that are in poor condition or are not properly grounded. - Microwave ovens are safe to be near or to operate.  Additional information for defibrillator patients should your device go off: - If your device goes off ONCE and you feel fine afterward, notify the device clinic nurses. - If your device goes off ONCE and you do not feel well afterward, call 911. - If your device goes off TWICE, call 911. - If your device goes off THREE times in one day, call 911.  DO NOT DRIVE YOURSELF OR A FAMILY MEMBER  WITH A DEFIBRILLATOR TO THE HOSPITAL--CALL 911.

## 2015-05-18 NOTE — Progress Notes (Signed)
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  The role of CLS pacing is expected to be more efficacious than standard pacing and having reviewed strips and history I think it is reasonable to pursue pacing for syncope and intermittent not controllable/reversible heart block  Well developed and nourished in no acute distress HENT normal Neck supple with JVP-flat Clear Regular rate and rhythm, no murmurs or gallops Abd-soft with active BS No Clubbing cyanosis edema Skin-warm and dry A & Oriented  Grossly normal sensory and motor function  Airway 2    She is still pretty banged up and we have spoken with PT about post implant limitations
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
## 2015-05-18 NOTE — Progress Notes (Signed)
Report called to [facility] 4 w. Pt Tx to 4w17.

## 2015-05-18 NOTE — Progress Notes (Addendum)
I was called by the RN for critical CXR result.  The patient has an apical pneumothorax as a result of pacemaker placement.  The patient appears comfortable.  Normal respiratory rate and O2 sats on RA 96%.   Chealsea Paske, PAC

## 2015-05-18 NOTE — Progress Notes (Signed)
Verbal orders received from Dr Graciela Husbands: CXR is to be done on 4W, and left AC IV is to remain in place.

## 2015-05-18 NOTE — Progress Notes (Signed)
Floor nurse called to state that patient needed to be transferred off due to PTX.  Cardiology paged to get details. Rapid Response nurse on floor to help manage details.  Cardiology to transfer patient to acute for CT placement and monitoring. Will discharge patient

## 2015-05-18 NOTE — Progress Notes (Signed)
20g IV catheter placed in left AC. One attempt.Two unsuccessful attempts in right upper extremity.

## 2015-05-18 NOTE — Progress Notes (Signed)
Physical Therapy Note  Patient Details  Name: Autumn Benjamin MRN: [id number] Date of Birth: 1940/10/26 Today's Date: 05/18/2015  Patient missed 60 min skilled therapy due to PPM placement this AM. Will f/u per POC.    Bayard Hugger A 05/18/2015, 11:11 AM

## 2015-05-18 NOTE — Progress Notes (Addendum)
Notified of CXR findings of left apical pneumothorax after pacemaker placement today. This represents about 25% volume loss. I spoke with the patient at bedside about the results. She reports mild chest discomfort and some shortness of breath, which she attributed to her recent rib fractures. Discussed with Dr. Delton Coombes of PCCM regarding the likely need for chest tube placement - he said they will evaluate her promptly.   Chrystie Nose, MD, [facility] Attending Cardiologist [facility]

## 2015-05-18 NOTE — Progress Notes (Signed)
Pt in cath holding post pacemaker insertion by Dr Graciela Husbands. Pt is awake, oriented, with no C/o discomfort. Site on left upper anterior chest wall is level zero with DDI. Currently in sinus tachycardia at 110 bpm. BP 93/53, o2 sat 95% on room air. Awaiting clarification of room assignment. Unable to access cath holding epic template for charting or orders. IT is to call with assistance.
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
## 2015-05-18 NOTE — Progress Notes (Signed)
Physical Therapy Session Note  Patient Details  Name: Quinne Pires MRN: [id number] Date of Birth: 07-Feb-1941  Today's Date: 05/18/2015 PT Missed Time: 60 Minutes Missed Time Reason: Other (Comment) (pt to OR for PPM this AM)    Nikira Kushnir E Penven-Crew 05/18/2015, 10:24 AM

## 2015-05-19 ENCOUNTER — Inpatient Hospital Stay (HOSPITAL_COMMUNITY): Payer: Medicare Other

## 2015-05-19 DIAGNOSIS — J939 Pneumothorax, unspecified: Secondary | ICD-10-CM

## 2015-05-19 DIAGNOSIS — I495 Sick sinus syndrome: Secondary | ICD-10-CM | POA: Insufficient documentation

## 2015-05-19 DIAGNOSIS — Z938 Other artificial opening status: Secondary | ICD-10-CM | POA: Insufficient documentation

## 2015-05-19 LAB — BASIC METABOLIC PANEL
ANION GAP: 10 (ref 5–15)
BUN: 10 mg/dL (ref 6–20)
CHLORIDE: 101 mmol/L (ref 101–111)
CO2: 26 mmol/L (ref 22–32)
Calcium: 9.3 mg/dL (ref 8.9–10.3)
Creatinine, Ser: 0.68 mg/dL (ref 0.44–1.00)
GFR calc Af Amer: >60 mL/min (ref >60)
GLUCOSE: 121 mg/dL — AB (ref 65–99)
POTASSIUM: 4.5 mmol/L (ref 3.5–5.1)
Sodium: 137 mmol/L (ref 135–145)

## 2015-05-19 LAB — CBC
HEMATOCRIT: 35.3 % — AB (ref 36.0–46.0)
HEMOGLOBIN: 11.3 g/dL — AB (ref 12.0–15.0)
MCH: 28.8 pg (ref 26.0–34.0)
MCHC: 32 g/dL (ref 30.0–36.0)
MCV: 89.8 fL (ref 78.0–100.0)
Platelets: 327 10*3/uL (ref 150–400)
RBC: 3.93 MIL/uL (ref 3.87–5.11)
RDW: 16.2 % — ABNORMAL HIGH (ref 11.5–15.5)
WBC: 5.7 10*3/uL (ref 4.0–10.5)

## 2015-05-19 MED ORDER — ONDANSETRON HCL 4 MG/2ML IJ SOLN
4.0000 mg | Freq: Four times a day (QID) | INTRAMUSCULAR | Status: DC | PRN
  Administered 2015-05-19 – 2015-05-23 (×4): 4 mg via INTRAVENOUS
  Filled 2015-05-19 (×4): qty 2

## 2015-05-19 MED ORDER — TRAZODONE HCL 50 MG PO TABS
50.0000 mg | ORAL_TABLET | Freq: Every day | ORAL | Status: DC
  Administered 2015-05-19 – 2015-05-23 (×6): 50 mg via ORAL
  Filled 2015-05-19 (×6): qty 1

## 2015-05-19 MED ORDER — CEPHALEXIN 250 MG PO CAPS
250.0000 mg | ORAL_CAPSULE | Freq: Three times a day (TID) | ORAL | Status: DC
  Administered 2015-05-19 – 2015-05-24 (×17): 250 mg via ORAL
  Filled 2015-05-19 (×20): qty 1

## 2015-05-19 MED ORDER — SODIUM CHLORIDE 0.9 % IJ SOLN
3.0000 mL | Freq: Two times a day (BID) | INTRAMUSCULAR | Status: DC
  Administered 2015-05-19 – 2015-05-23 (×11): 3 mL via INTRAVENOUS

## 2015-05-19 MED ORDER — TRAMADOL HCL 50 MG PO TABS
100.0000 mg | ORAL_TABLET | Freq: Four times a day (QID) | ORAL | Status: DC
  Administered 2015-05-19 – 2015-05-24 (×22): 100 mg via ORAL
  Filled 2015-05-19 (×22): qty 2

## 2015-05-19 MED ORDER — PANTOPRAZOLE SODIUM 40 MG PO TBEC
40.0000 mg | DELAYED_RELEASE_TABLET | Freq: Every day | ORAL | Status: DC
  Administered 2015-05-19 – 2015-05-23 (×5): 40 mg via ORAL
  Filled 2015-05-19 (×5): qty 1

## 2015-05-19 MED ORDER — ASPIRIN EC 81 MG PO TBEC
81.0000 mg | DELAYED_RELEASE_TABLET | Freq: Every day | ORAL | Status: DC
  Administered 2015-05-19 – 2015-05-23 (×5): 81 mg via ORAL
  Filled 2015-05-19 (×5): qty 1

## 2015-05-19 MED ORDER — LEVOTHYROXINE SODIUM 100 MCG PO TABS
100.0000 ug | ORAL_TABLET | Freq: Every day | ORAL | Status: DC
  Administered 2015-05-19 – 2015-05-24 (×6): 100 ug via ORAL
  Filled 2015-05-19 (×6): qty 1

## 2015-05-19 MED ORDER — HYDROMORPHONE HCL 2 MG PO TABS
2.0000 mg | ORAL_TABLET | ORAL | Status: DC | PRN
  Filled 2015-05-19: qty 1

## 2015-05-19 MED ORDER — ENOXAPARIN SODIUM 40 MG/0.4ML ~~LOC~~ SOLN
40.0000 mg | SUBCUTANEOUS | Status: DC
  Administered 2015-05-19 – 2015-05-23 (×5): 40 mg via SUBCUTANEOUS
  Filled 2015-05-19 (×5): qty 0.4
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
## 2015-05-19 NOTE — Progress Notes (Signed)
Report received from 4W. Pt arrived to unit. VSS. MD & rapid aware. MD at bedside to insert chest tube. Pt tolerated well. Will continue to monitor closely.  Sandrea Hammond  RN

## 2015-05-19 NOTE — Progress Notes (Signed)
No orders entered for chest tube. Pulmonary/ critical care MD paged. No response. Information passed on to day shift nurse.  Sandrea Hammond RN

## 2015-05-19 NOTE — Procedures (Signed)
Chest Tube Insertion Procedure Note  Indications:  Clinically significant Pneumothorax  Pre-operative Diagnosis: Pneumothorax  Post-operative Diagnosis: Pneumothorax  Procedure Details  Informed consent was obtained for the procedure, including sedation.  Risks of lung perforation, hemorrhage, arrhythmia, and adverse drug reaction were discussed.   After sterile skin prep, using standard technique, lidocaine was injected, skin incision done followed by needle and placement of wire, incision dilated and Wayne catheter placed, attached to sahara and sutured, placed lateral position 5th rib space.  Findings: None  Estimated Blood Loss:  Minimal         Specimens:  None              Complications:  None; patient tolerated the procedure well.         Disposition: Tele         Condition: stable  Attending Attestation: I performed the procedure.  CXR ordered and pending.  Alyson Reedy, M.D. [facility] Pulmonary/Critical Care Medicine. Pager: [phone]. After hours pager: [phone].
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
## 2015-05-20 ENCOUNTER — Inpatient Hospital Stay (HOSPITAL_COMMUNITY): Payer: Medicare Other

## 2015-05-20 ENCOUNTER — Inpatient Hospital Stay (HOSPITAL_COMMUNITY): Payer: Self-pay | Admitting: Physical Therapy

## 2015-05-20 ENCOUNTER — Inpatient Hospital Stay (HOSPITAL_COMMUNITY): Payer: Self-pay | Admitting: Occupational Therapy

## 2015-05-20 ENCOUNTER — Encounter (HOSPITAL_COMMUNITY): Payer: Self-pay

## 2015-05-20 ENCOUNTER — Inpatient Hospital Stay (HOSPITAL_COMMUNITY): Payer: Self-pay

## 2015-05-20 DIAGNOSIS — R0782 Intercostal pain: Secondary | ICD-10-CM

## 2015-05-20 MED FILL — Heparin Sodium (Porcine) 2 Unit/ML in Sodium Chloride 0.9%: INTRAMUSCULAR | Qty: 500 | Status: AC
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
## 2015-05-20 NOTE — Progress Notes (Signed)
Utilization review completed.  
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
## 2015-05-20 NOTE — Progress Notes (Signed)
Pt ECG HR jumps from 100 to 130 then back to 80's or low 100's. Pt. Asleep and asymptomatic. Will continue to monitor closely.  Rhohina H Jadji
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
## 2015-05-21 ENCOUNTER — Inpatient Hospital Stay (HOSPITAL_COMMUNITY): Payer: Medicare Other

## 2015-05-21 DIAGNOSIS — J939 Pneumothorax, unspecified: Secondary | ICD-10-CM | POA: Insufficient documentation

## 2015-05-21 MED ORDER — CETYLPYRIDINIUM CHLORIDE 0.05 % MT LIQD
7.0000 mL | Freq: Two times a day (BID) | OROMUCOSAL | Status: DC
  Administered 2015-05-21 – 2015-05-23 (×4): 7 mL via OROMUCOSAL
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
## 2015-05-21 NOTE — Progress Notes (Signed)
Nursing note  Patient chest tube placed to water seal as ordered, dressing clean dry intact will continue to monitor patient. Floris Neuhaus, Randall An RN

## 2015-05-22 ENCOUNTER — Inpatient Hospital Stay (HOSPITAL_COMMUNITY): Payer: Medicare Other

## 2015-05-22 NOTE — Progress Notes (Signed)
Patient ID: Autumn Benjamin, female   DOB: May 14, 1941, 74 y.o.   MRN: [id number]    SUBJECTIVE: The patient is s/p chest tube for PTX. Today she has minimal incisional pain at chest tube site and pacer sight   . antiseptic oral rinse  7 mL Mouth Rinse BID  . aspirin EC  81 mg Oral Daily  . cephALEXin  250 mg Oral 3 times per day  . enoxaparin (LOVENOX) injection  40 mg Subcutaneous Q24H  . levothyroxine  100 mcg Oral QAC breakfast  . pantoprazole  40 mg Oral Daily  . sodium chloride  3 mL Intravenous Q12H  . traMADol  100 mg Oral 4 times per day  . traZODone  50 mg Oral QHS      OBJECTIVE: Physical Exam: Filed Vitals:   05/21/15 0527 05/21/15 1439 05/21/15 1947 05/22/15 0652  BP: 99/55 111/69 119/57 106/58  Pulse: 103 108 104 105  Temp: 97.9 F (36.6 C) 97.7 F (36.5 C) 98.1 F (36.7 C) 97.7 F (36.5 C)  TempSrc: Oral Oral Oral Oral  Resp: 16 18 16 16   Height:      SpO2: 100% 100% 100% 100%    Intake/Output Summary (Last 24 hours) at 05/22/15 1025 Last data filed at 05/22/15 0736  Gross per 24 hour  Intake      3 ml  Output      0 ml  Net      3 ml    Telemetry reveals sinus rhythm with ventricular pacing 05/22/2015   GEN- The patient is well appearing, alert and oriented x 3 today.   Head- normocephalic, atraumatic Eyes-  Sclera clear, conjunctiva pink Ears- hearing intact Oropharynx- clear Neck- supple, no JVP Lymph- no cervical lymphadenopathy Lungs- Clear to ausculation bilaterally, normal work of breathing high left sided chest tube  Heart- Regular rate and rhythm, no murmurs, rubs or gallops, PMI not laterally displaced GI- soft, NT, ND, + BS Extremities- no clubbing, cyanosis, or edema Skin- no rash or lesion Psych- euthymic mood, full affect Neuro- strength and sensation are intact   RADIOLOGY: CXR:    ASSESSMENT AND PLAN:  Principal Problem:   Pneumothorax Active Problems:   Sick sinus syndrome (HCC)   Status post chest tube placement  (HCC)   Pneumothorax on left   PPM:  Sight looks good normal function Pneumothorax:  Discussed with Dr 05/24/2015.  On water seal all night will clamp today not likely to be removed until am.  CXR with stble small left apical pneumo thorax   Maple Hudson, MD 05/22/2015 10:25 AM
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
## 2015-05-23 ENCOUNTER — Inpatient Hospital Stay (HOSPITAL_COMMUNITY): Payer: Medicare Other
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
## 2015-05-23 NOTE — Progress Notes (Signed)
[employer] has denied readmission for pt to inpt rehab. Pt not felt to be able to reach a level of independence in the next 1 -2 weeks for direct d/c home. SNF rehab is recommended by [facility]. I spoke with pt at bedside and a conference call with pt's son, Luisa Hart. They wish to appeal this insurance decision. I will obtain the specifics for the appeal by family. Pt wishes clarification of when d/c anticipated which I explained likely Tuesday. Amada Jupiter, SW from inpt rehab to come see pt per her request to answer questions concerning SNF placement. I have alerted RN CM and SW of insurance [phone]

## 2015-05-23 NOTE — Clinical Social Work Placement (Signed)
   CLINICAL SOCIAL WORK PLACEMENT  NOTE  Date:  05/23/2015  Patient Details  Name: Autumn Benjamin MRN: [id number] Date of Birth: 08-29-1940  Clinical Social Work is seeking post-discharge placement for this patient at the Skilled  Nursing Facility level of care (*CSW will initial, date and re-position this form in  chart as items are completed):  Yes   Patient/family provided with [facility] Clinical Social Work Department's list of facilities offering this level of care within the geographic area requested by the patient (or if unable, by the patient's family).  Yes   Patient/family informed of their freedom to choose among providers that offer the needed level of care, that participate in Medicare, Medicaid or managed care program needed by the patient, have an available bed and are willing to accept the patient.  Yes   Patient/family informed of [facility]'s ownership interest in [facility] and [facility], as well as of the fact that they are under no obligation to receive care at these facilities.  PASRR submitted to EDS on       PASRR number received on       Existing PASRR number confirmed on 05/23/15     FL2 transmitted to all facilities in geographic area requested by pt/family on 05/23/15     FL2 transmitted to all facilities within larger geographic area on       Patient informed that his/her managed care company has contracts with or will negotiate with certain facilities, including the following:            Patient/family informed of bed offers received.  Patient chooses bed at       Physician recommends and patient chooses bed at      Patient to be transferred to   on  .  Patient to be transferred to facility by       Patient family notified on   of transfer.  Name of family member notified:        PHYSICIAN       Additional Comment:    _______________________________________________ Rondel Baton, LCSW 05/23/2015, 9:34 AM

## 2015-05-23 NOTE — Clinical Social Work Note (Signed)
Clinical Social Work Assessment  Patient Details  Name: Autumn Benjamin MRN: [id number] Date of Birth: May 03, 1941  Date of referral:  05/23/15               Reason for consult:  Facility Placement                Permission sought to share information with:  Oceanographer granted to share information::  Yes, Verbal Permission Granted  Name::        Agency::  [facility] SNFs  Relationship::     Contact Information:     Housing/Transportation Living arrangements for the past 2 months:   ([facility] ) Source of Information:  Patient Patient Interpreter Needed:  None Criminal Activity/Legal Involvement Pertinent to Current Situation/Hospitalization:  No - Comment as needed Significant Relationships:  Adult Children Lives with:    Do you feel safe going back to the place where you live?  Yes (wishes to return to CIR) Need for family participation in patient care:  No (Coment)  Care giving concerns:  No caregivers present at time of this assessment.   Social Worker assessment / plan:  CSW spoke with patient regarding PT recommendations- SNF.  Patient is adamant in regards to returning to CIR to complete physical therapy.  Patient became visibly upset and tearful speaking about the possibility of not being able to return to CIR.  CSW advised patient that CSW would facilitate CIR contact with patient.  Patient was hopeful in regards to returning.  Patient is agreeable to a SNF search as a second plan to CIR though is not sold out on the idea of SNF.  CSW offered support and will continue to follow and assist as necessary.  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  Patient is agreeable to SNF search, but not agreeable to SNF admission at this time.  Patient/Family's Understanding of and Emotional Response to Diagnosis, Current Treatment, and Prognosis:  Patient is fully aware and realistic regarding her prognosis and diagnosis.    Emotional Assessment Appearance:  Appears stated age Attitude/Demeanor/Rapport:   (appropriate) Affect (typically observed):  Accepting, Adaptable, Pleasant, Tearful/Crying Orientation:  Oriented to Self, Oriented to Place, Oriented to  Time, Oriented to Situation Alcohol / Substance use:    Psych involvement (Current and /or in the community):  No (Comment)  Discharge Needs  Concerns to be addressed:  No discharge needs identified Readmission within the last 30 days:  No Current discharge risk:  None Barriers to Discharge:  Continued Medical Work up   [employer], LCSW 05/23/2015, 9:30 AM

## 2015-05-23 NOTE — Clinical Social Work Note (Signed)
CSW received consult for possible SNF placement at time of discharge.  Patient is adamant about returning to CIR and not SNF.  Patient became visibly upset and tearful during this conversation.  CSW contacted CIR admission's coordinator and made aware of patient's request.  CIR admission's coordinator, Britta Mccreedy, to see patient today.    Vickii Penna, LCSW [phone]  [facility] Licensed Clinical Social Worker

## 2015-05-23 NOTE — Progress Notes (Signed)
Chest tube to left side rib cage removed.  Pt tol well.  Pt denies pain, SOB.  Catheter intact upon removal.  Will cont to monitor.

## 2015-05-23 NOTE — Care Management Important Message (Signed)
Important Message  Patient Details  Name: Autumn Benjamin MRN: [id number] Date of Birth: 1941-05-24   Medicare Important Message Given:  Yes    Kyla Balzarine 05/23/2015, 3:10 PM

## 2015-05-23 NOTE — Progress Notes (Signed)
I met with pt at bedside who continues to be visibly upset with the thought of SNF rehab. Pt prefers readmit to inpt rehab. I will begin authorization with [facility] Medicare for a possible admission pending their approval and medical readiness after removal of chest tube today. I have discussed with RN [place] and SW. [phone]

## 2015-05-24 ENCOUNTER — Inpatient Hospital Stay (HOSPITAL_COMMUNITY): Payer: Medicare Other

## 2015-05-24 DIAGNOSIS — I442 Atrioventricular block, complete: Principal | ICD-10-CM

## 2015-05-24 NOTE — Discharge Instructions (Signed)
° ° °  Supplemental Discharge Instructions for  Pacemaker/Defibrillator Patients  Activity No heavy lifting or vigorous activity with your left/right arm for 6 to 8 weeks.     WOUND CARE - Keep the wound area clean and dry.  Do not get this area wet for one week.   - The tape/steri-strips on your wound will fall off; do not pull them off.  No bandage is needed on the site.  DO  NOT apply any creams, oils, or ointments to the wound area. - If you notice any drainage or discharge from the wound, any swelling or bruising at the site, or you develop a fever > 101? F after you are discharged home, call the office at once.  Special Instructions - You are still able to use cellular telephones; use the ear opposite the side where you have your pacemaker/defibrillator.  Avoid carrying your cellular phone near your device. - When traveling through airports, show security personnel your identification card to avoid being screened in the metal detectors.  Ask the security personnel to use the hand wand. - Avoid arc welding equipment, MRI testing (magnetic resonance imaging), TENS units (transcutaneous nerve stimulators).  Call the office for questions about other devices. - Avoid electrical appliances that are in poor condition or are not properly grounded. - Microwave ovens are safe to be near or to operate.
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
## 2015-05-24 NOTE — Progress Notes (Addendum)
I met with pt at bedside to discuss her d/c plans. I continue to recommend SNF rehab as we spoke yesterday. I discussed with RN CM. [phone] I provided pt with appeals # if she would like to pursue with there insurance. [phone] for Appeals

## 2015-05-24 NOTE — Progress Notes (Signed)
Discharge Note:  Patient alert and oriented X 4 and in no distress apart from mild anxiety about being discharged.  Patient going to SNF so all necessary documentation has been forwarded to SNF by CSW.  Patient being transported to [facility] via EMS. Spoke to receiving nurse Kenney Houseman at [facility] who stated that she had all the information she needed in the patient's admission packet and did not require a detailed report.
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
## 2015-05-24 NOTE — Clinical Social Work Note (Signed)
Patient will discharge today per MD order. Patient will discharge to [facility] RN to call report prior to transportation to: [phone] Transportation: [facility]- to be scheduled between 1:30-2:30pm  CSW sent discharge summary to SNF for review.  Packet is complete.  RN, patient and family aware of discharge plans.  Vickii Penna, LCSWA [phone]  Psychiatric & Orthopedics (5N 1-16) Clinical Social Worker     [facility] is able to offer patient a bed today.  CIR's assistance in this placement was greatly appreciated.  Insurance authorization has been received and is as follows: [id number], next review date 12/1, RVC.  Patient has been updated and is agreeable to this discharge plan.
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
## 2015-05-30 ENCOUNTER — Ambulatory Visit: Payer: Self-pay

## 2015-05-31 ENCOUNTER — Telehealth: Payer: Self-pay | Admitting: Internal Medicine

## 2015-05-31 NOTE — Telephone Encounter (Signed)
F/u  Pt son calling back to follow up on prev message. Please call back and discuss.

## 2015-05-31 NOTE — Telephone Encounter (Signed)
Spoke with son today   Will try and get transmission from [facility] in am and also has a wound check on Thursday. The son says her heart rates have been going up to 200. There've also been issues of blood pressure and so she has not been able to do her rehabilitation.
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
## 2015-06-01 NOTE — Telephone Encounter (Signed)
Nightly remote transmission received and printed for review by Dr. Graciela Husbands.

## 2015-06-01 NOTE — Telephone Encounter (Signed)
Will forward to [facility] as an FYI and to look out for transmission.  The patient has a Biotronik device implanted on 11/23. I am uncertain if they will need help with the transmission at [facility].

## 2015-06-02 ENCOUNTER — Encounter: Payer: Self-pay | Admitting: Internal Medicine

## 2015-06-02 ENCOUNTER — Ambulatory Visit
Admission: RE | Admit: 2015-06-02 | Discharge: 2015-06-02 | Disposition: A | Discharge status: Home / Self Care | Payer: Medicare Other | Source: Ambulatory Visit | Attending: Internal Medicine | Admitting: Internal Medicine

## 2015-06-02 ENCOUNTER — Telehealth: Payer: Self-pay | Admitting: *Deleted

## 2015-06-02 ENCOUNTER — Ambulatory Visit (INDEPENDENT_AMBULATORY_CARE_PROVIDER_SITE_OTHER): Payer: Medicare Other | Admitting: *Deleted

## 2015-06-02 VITALS — BP 86/62 | HR 117

## 2015-06-02 DIAGNOSIS — I495 Sick sinus syndrome: Secondary | ICD-10-CM

## 2015-06-02 DIAGNOSIS — R000 Tachycardia, unspecified: Secondary | ICD-10-CM

## 2015-06-02 DIAGNOSIS — I959 Hypotension, unspecified: Secondary | ICD-10-CM

## 2015-06-02 DIAGNOSIS — I442 Atrioventricular block, complete: Secondary | ICD-10-CM | POA: Diagnosis not present

## 2015-06-02 LAB — CUP PACEART INCLINIC DEVICE CHECK
Date Time Interrogation Session: 20161208184752
Implantable Lead Implant Date: 20161123
Implantable Lead Location: 753859
Implantable Lead Model: 5076
Lead Channel Pacing Threshold Amplitude: 0.8 V
Lead Channel Pacing Threshold Amplitude: 1.2 V
Lead Channel Pacing Threshold Pulse Width: 0.4 ms
Lead Channel Sensing Intrinsic Amplitude: 15.8 mV
Lead Channel Setting Pacing Amplitude: 3 V
Lead Channel Setting Pacing Pulse Width: 0.4 ms
MDC IDC LEAD IMPLANT DT: 20161123
MDC IDC LEAD LOCATION: 753860
MDC IDC MSMT LEADCHNL RA IMPEDANCE VALUE: 409 Ohm
MDC IDC MSMT LEADCHNL RA PACING THRESHOLD PULSEWIDTH: 0.4 ms
MDC IDC MSMT LEADCHNL RA SENSING INTR AMPL: 6.3 mV
MDC IDC MSMT LEADCHNL RV IMPEDANCE VALUE: 487 Ohm
MDC IDC SET LEADCHNL RV PACING AMPLITUDE: 3 V
MDC IDC STAT BRADY RA PERCENT PACED: 1 %
Pulse Gen Serial Number: 68664772

## 2015-06-02 LAB — BASIC METABOLIC PANEL
BUN: 29 mg/dL — ABNORMAL HIGH (ref 7–25)
CO2: 25 mmol/L (ref 20–31)
Calcium: 10 mg/dL (ref 8.6–10.4)
Chloride: 94 mmol/L — ABNORMAL LOW (ref 98–110)
Creat: 1.27 mg/dL — ABNORMAL HIGH (ref 0.60–0.93)
Glucose, Bld: 104 mg/dL — ABNORMAL HIGH (ref 65–99)
POTASSIUM: 4 mmol/L (ref 3.5–5.3)
SODIUM: 136 mmol/L (ref 135–146)

## 2015-06-02 MED ORDER — FLUDROCORTISONE ACETATE 0.1 MG PO TABS: 0.1000 mg | ORAL_TABLET | Freq: Two times a day (BID) | ORAL | Status: DC

## 2015-06-02 NOTE — Progress Notes (Signed)
Wound check appointment. Wound without redness, small, purple fading to yellow hematoma noted ~2" below incision, Gypsy Balsam, NP saw patient and recommended no additional treatment related to healing hematoma. Incision edges approximated, wound well healed. Normal device function. Thresholds, sensing, and impedances consistent with implant measurements. Device programmed at 3.0V for extra safety margin until 3 month visit. Histogram distribution indicates patient's median HR ~100bpm. No mode switches. 8 high ventricular rates noted, available EGMs indicate 1:1 SVT, peak V 171bpm. Patient educated about wound care, arm mobility, lifting restrictions. ROV with WC on 06/09/15.

## 2015-06-02 NOTE — Progress Notes (Signed)
Dr. Ladona Ridgel reviewed patient's BP of 86/62, HR 117bpm, and PPM presenting rhythm.  Ordered BMP, BNP, CBC, and chest x-ray to be performed today.  Patient and aide aware and agreeable to plan.  Follow-up appointment scheduled with Dr. Elberta Fortis on 06/09/15 at 8:30am.  Patient and aide brought to lab and then planning to proceed to have chest x-ray performed.  Reviewed with Dr. Graciela Husbands when in office this afternoon, Florinef 0.1mg  and echocardiogram initially ordered, than discontinued by Dr. Graciela Husbands.  See phone note from Dennis Bast, RN for progression of events.

## 2015-06-02 NOTE — Telephone Encounter (Signed)
Called and spoke with [facility]: Lambert Rhonda W at [facility] in regards to patient and having an echo tomorrow per Dr Graciela Husbands for hypotension and tachycardia of unknown etiology.  She is not sure she will be able to get a transportation request in for tomorrow as they are gone for the day.  We have also faxed over an order for Florinef 0.1mg  bid.    Her BP and HR trends from the hospital were BP low and HR tachy.  Today her BP's are much lower and after Dr Graciela Husbands reviewed he suggested the patient have an echo tomorrow.  As I was speaking with facility Dr Graciela Husbands was speaking with patient's son and after much thought felt the patient may need to return to the ER for further evaluation in regards to her hypotension and tachycardia.  He feels if she is stable now may return to the ER in the morning but in BP still in the 80's go tonight.  Further orders received form Dr Graciela Husbands to d/c Florinef.

## 2015-06-03 ENCOUNTER — Other Ambulatory Visit (HOSPITAL_COMMUNITY): Payer: Medicare Other

## 2015-06-03 ENCOUNTER — Observation Stay (HOSPITAL_COMMUNITY)
Admission: EM | Admit: 2015-06-03 | Discharge: 2015-06-04 | Disposition: A | Discharge status: Skilled Nursing Facility | Payer: Medicare Other | Attending: Cardiology | Admitting: Cardiology

## 2015-06-03 ENCOUNTER — Emergency Department (HOSPITAL_COMMUNITY): Payer: Medicare Other

## 2015-06-03 ENCOUNTER — Other Ambulatory Visit: Payer: Self-pay

## 2015-06-03 ENCOUNTER — Encounter (HOSPITAL_COMMUNITY): Payer: Self-pay | Admitting: Emergency Medicine

## 2015-06-03 ENCOUNTER — Telehealth: Payer: Self-pay | Admitting: Cardiology

## 2015-06-03 DIAGNOSIS — I10 Essential (primary) hypertension: Secondary | ICD-10-CM | POA: Insufficient documentation

## 2015-06-03 DIAGNOSIS — Z7982 Long term (current) use of aspirin: Secondary | ICD-10-CM | POA: Insufficient documentation

## 2015-06-03 DIAGNOSIS — Z79899 Other long term (current) drug therapy: Secondary | ICD-10-CM | POA: Insufficient documentation

## 2015-06-03 DIAGNOSIS — E559 Vitamin D deficiency, unspecified: Secondary | ICD-10-CM | POA: Diagnosis not present

## 2015-06-03 DIAGNOSIS — M069 Rheumatoid arthritis, unspecified: Secondary | ICD-10-CM | POA: Diagnosis not present

## 2015-06-03 DIAGNOSIS — Z87898 Personal history of other specified conditions: Secondary | ICD-10-CM

## 2015-06-03 DIAGNOSIS — Z950 Presence of cardiac pacemaker: Secondary | ICD-10-CM | POA: Insufficient documentation

## 2015-06-03 DIAGNOSIS — E43 Unspecified severe protein-calorie malnutrition: Secondary | ICD-10-CM | POA: Diagnosis not present

## 2015-06-03 DIAGNOSIS — Z6824 Body mass index (BMI) 24.0-24.9, adult: Secondary | ICD-10-CM | POA: Diagnosis not present

## 2015-06-03 DIAGNOSIS — K589 Irritable bowel syndrome without diarrhea: Secondary | ICD-10-CM | POA: Diagnosis not present

## 2015-06-03 DIAGNOSIS — I959 Hypotension, unspecified: Secondary | ICD-10-CM | POA: Diagnosis present

## 2015-06-03 DIAGNOSIS — E039 Hypothyroidism, unspecified: Secondary | ICD-10-CM | POA: Diagnosis not present

## 2015-06-03 DIAGNOSIS — R000 Tachycardia, unspecified: Secondary | ICD-10-CM

## 2015-06-03 DIAGNOSIS — E876 Hypokalemia: Secondary | ICD-10-CM | POA: Insufficient documentation

## 2015-06-03 HISTORY — DX: Hypokalemia: E87.6

## 2015-06-03 HISTORY — DX: Unspecified severe protein-calorie malnutrition: E43

## 2015-06-03 LAB — CBC WITH DIFFERENTIAL/PLATELET

## 2015-06-03 LAB — CBC
HEMATOCRIT: 42.1 % (ref 36.0–46.0)
Hemoglobin: 13.8 g/dL (ref 12.0–15.0)
MCH: 28.2 pg (ref 26.0–34.0)
MCHC: 32.8 g/dL (ref 30.0–36.0)
MCV: 86.1 fL (ref 78.0–100.0)
PLATELETS: 222 10*3/uL (ref 150–400)
RBC: 4.89 MIL/uL (ref 3.87–5.11)
RDW: 15 % (ref 11.5–15.5)
WBC: 8 10*3/uL (ref 4.0–10.5)

## 2015-06-03 LAB — I-STAT TROPONIN, ED: Troponin i, poc: 0.01 ng/mL (ref 0.00–0.08)

## 2015-06-03 LAB — COMPREHENSIVE METABOLIC PANEL
ALBUMIN: 4.3 g/dL (ref 3.5–5.0)
ALT: 28 U/L (ref 14–54)
ANION GAP: 13 (ref 5–15)
AST: 36 U/L (ref 15–41)
Alkaline Phosphatase: 195 U/L — ABNORMAL HIGH (ref 38–126)
BILIRUBIN TOTAL: 0.7 mg/dL (ref 0.3–1.2)
BUN: 23 mg/dL — ABNORMAL HIGH (ref 6–20)
CO2: 28 mmol/L (ref 22–32)
Calcium: 9.9 mg/dL (ref 8.9–10.3)
Chloride: 95 mmol/L — ABNORMAL LOW (ref 101–111)
Creatinine, Ser: 0.97 mg/dL (ref 0.44–1.00)
GFR calc Af Amer: >60 mL/min (ref >60)
GFR calc non Af Amer: 56 mL/min — ABNORMAL LOW (ref >60)
GLUCOSE: 110 mg/dL — AB (ref 65–99)
POTASSIUM: 3 mmol/L — AB (ref 3.5–5.1)
SODIUM: 136 mmol/L (ref 135–145)
TOTAL PROTEIN: 7 g/dL (ref 6.5–8.1)

## 2015-06-03 LAB — BRAIN NATRIURETIC PEPTIDE: BRAIN NATRIURETIC PEPTIDE: 10.4 pg/mL (ref 0.0–100.0)

## 2015-06-03 LAB — I-STAT CG4 LACTIC ACID, ED
LACTIC ACID, VENOUS: 1.4 mmol/L (ref 0.5–2.0)
Lactic Acid, Venous: 1.44 mmol/L (ref 0.5–2.0)

## 2015-06-03 MED ORDER — POTASSIUM CHLORIDE CRYS ER 20 MEQ PO TBCR
60.0000 meq | EXTENDED_RELEASE_TABLET | Freq: Once | ORAL | Status: AC
  Administered 2015-06-03: 60 meq via ORAL
  Filled 2015-06-03: qty 3

## 2015-06-03 MED ORDER — SODIUM CHLORIDE 0.9 % IV BOLUS (SEPSIS)
1000.0000 mL | Freq: Once | INTRAVENOUS | Status: AC
  Administered 2015-06-03: 1000 mL via INTRAVENOUS

## 2015-06-03 MED ORDER — LOPERAMIDE HCL 2 MG PO CAPS
2.0000 mg | ORAL_CAPSULE | Freq: Once | ORAL | Status: AC
  Administered 2015-06-03: 2 mg via ORAL
  Filled 2015-06-03: qty 1

## 2015-06-03 MED ORDER — POTASSIUM CHLORIDE 10 MEQ/100ML IV SOLN
10.0000 meq | INTRAVENOUS | Status: AC
  Administered 2015-06-03 (×2): 10 meq via INTRAVENOUS
  Filled 2015-06-03 (×2): qty 100

## 2015-06-03 MED ORDER — IOHEXOL 350 MG/ML SOLN
75.0000 mL | Freq: Once | INTRAVENOUS | Status: AC | PRN
  Administered 2015-06-03: 75 mL via INTRAVENOUS

## 2015-06-03 NOTE — Telephone Encounter (Signed)
°  Follow Up   Calling on behalf of pt. Following up on test results. Please call.

## 2015-06-03 NOTE — Telephone Encounter (Signed)
Returned facilities call and per charge nurse patient was sent to [facility] as it was closer, transported via facility's transportation service.  Patient and son currently at ED at [facility].  Returned son's call.  Son is upset that patient was taken to [facility] instead of [facility] because she has not received care there previously.  He wants her to be treated at [facility] instead.  As patient is still in ED and has not been seen by provider yet, advised son to transport patient to [facility] ED.  Dr. Graciela Husbands made aware that patient will present to ED.  Updated son on available labs and chest x-ray results per his request.  He is concerned about the situation, advised that currently, patient needs to be seen in ED so that cause of hypotension and tachycardia can be addressed.  He verbalizes understanding and is agreeable to this plan.  He is appreciative of our call and denies additional questions at this time.

## 2015-06-03 NOTE — Telephone Encounter (Signed)
Follow up      Son want to talk to a nurse because pt is at [facility] and he has questions.
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
## 2015-06-03 NOTE — Telephone Encounter (Signed)
Follow up      Nurse is requesting to talk to [facility]
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
## 2015-06-04 ENCOUNTER — Observation Stay (HOSPITAL_BASED_OUTPATIENT_CLINIC_OR_DEPARTMENT_OTHER): Payer: Medicare Other

## 2015-06-04 ENCOUNTER — Other Ambulatory Visit: Payer: Self-pay

## 2015-06-04 ENCOUNTER — Encounter (HOSPITAL_COMMUNITY): Payer: Self-pay | Admitting: Cardiology

## 2015-06-04 DIAGNOSIS — E876 Hypokalemia: Secondary | ICD-10-CM | POA: Diagnosis not present

## 2015-06-04 DIAGNOSIS — E43 Unspecified severe protein-calorie malnutrition: Secondary | ICD-10-CM

## 2015-06-04 DIAGNOSIS — K589 Irritable bowel syndrome without diarrhea: Secondary | ICD-10-CM | POA: Diagnosis not present

## 2015-06-04 DIAGNOSIS — R55 Syncope and collapse: Secondary | ICD-10-CM

## 2015-06-04 DIAGNOSIS — Z950 Presence of cardiac pacemaker: Secondary | ICD-10-CM

## 2015-06-04 DIAGNOSIS — I959 Hypotension, unspecified: Secondary | ICD-10-CM | POA: Diagnosis not present

## 2015-06-04 DIAGNOSIS — I10 Essential (primary) hypertension: Secondary | ICD-10-CM | POA: Diagnosis not present

## 2015-06-04 DIAGNOSIS — Z87898 Personal history of other specified conditions: Secondary | ICD-10-CM

## 2015-06-04 HISTORY — DX: Hypokalemia: E87.6

## 2015-06-04 HISTORY — DX: Unspecified severe protein-calorie malnutrition: E43

## 2015-06-04 LAB — BASIC METABOLIC PANEL
Anion gap: 11 (ref 5–15)
BUN: 19 mg/dL (ref 6–20)
CHLORIDE: 104 mmol/L (ref 101–111)
CO2: 22 mmol/L (ref 22–32)
CREATININE: 0.8 mg/dL (ref 0.44–1.00)
Calcium: 9.2 mg/dL (ref 8.9–10.3)
GFR calc non Af Amer: >60 mL/min (ref >60)
GLUCOSE: 111 mg/dL — AB (ref 65–99)
Potassium: 4 mmol/L (ref 3.5–5.1)
SODIUM: 137 mmol/L (ref 135–145)

## 2015-06-04 LAB — TSH: TSH: 1.037 u[IU]/mL (ref 0.350–4.500)

## 2015-06-04 LAB — CBC
HCT: 36.3 % (ref 36.0–46.0)
HEMOGLOBIN: 11.7 g/dL — AB (ref 12.0–15.0)
MCH: 27.8 pg (ref 26.0–34.0)
MCHC: 32.2 g/dL (ref 30.0–36.0)
MCV: 86.2 fL (ref 78.0–100.0)
PLATELETS: 188 10*3/uL (ref 150–400)
RBC: 4.21 MIL/uL (ref 3.87–5.11)
RDW: 15.2 % (ref 11.5–15.5)
WBC: 7.8 10*3/uL (ref 4.0–10.5)

## 2015-06-04 MED ORDER — PRO-STAT SUGAR FREE PO LIQD: 30.0000 mL | Freq: Two times a day (BID) | ORAL | Status: DC

## 2015-06-04 MED ORDER — PROSIGHT PO TABS: 1.0000 | ORAL_TABLET | Freq: Every day | ORAL | Status: DC

## 2015-06-04 MED ORDER — LEFLUNOMIDE 20 MG PO TABS
10.0000 mg | ORAL_TABLET | Freq: Every day | ORAL | Status: DC
  Administered 2015-06-04: 10 mg via ORAL
  Filled 2015-06-04: qty 1

## 2015-06-04 MED ORDER — PROSIGHT PO TABS
1.0000 | ORAL_TABLET | Freq: Every day | ORAL | Status: DC
  Filled 2015-06-04: qty 1

## 2015-06-04 MED ORDER — ACETAMINOPHEN 325 MG PO TABS: 650.0000 mg | ORAL_TABLET | ORAL | Status: DC | PRN

## 2015-06-04 MED ORDER — PANTOPRAZOLE SODIUM 40 MG PO TBEC
40.0000 mg | DELAYED_RELEASE_TABLET | Freq: Every day | ORAL | Status: DC
  Administered 2015-06-04: 40 mg via ORAL
  Filled 2015-06-04: qty 1

## 2015-06-04 MED ORDER — ASPIRIN EC 81 MG PO TBEC
81.0000 mg | DELAYED_RELEASE_TABLET | Freq: Every day | ORAL | Status: DC
  Administered 2015-06-04: 81 mg via ORAL
  Filled 2015-06-04: qty 1

## 2015-06-04 MED ORDER — TRAMADOL HCL 50 MG PO TABS: 50.0000 mg | ORAL_TABLET | Freq: Four times a day (QID) | ORAL | Status: DC | PRN

## 2015-06-04 MED ORDER — ETANERCEPT 50 MG/ML ~~LOC~~ SOAJ: 50.0000 mg | SUBCUTANEOUS | Status: DC

## 2015-06-04 MED ORDER — ENSURE ENLIVE PO LIQD: 237.0000 mL | Freq: Two times a day (BID) | ORAL | Status: DC

## 2015-06-04 MED ORDER — LEVOTHYROXINE SODIUM 100 MCG PO TABS
100.0000 ug | ORAL_TABLET | Freq: Every day | ORAL | Status: DC
  Administered 2015-06-04: 100 ug via ORAL
  Filled 2015-06-04: qty 1

## 2015-06-04 MED ORDER — ADULT MULTIVITAMIN W/MINERALS CH
1.0000 | ORAL_TABLET | Freq: Every day | ORAL | Status: DC
  Filled 2015-06-04: qty 1

## 2015-06-04 MED ORDER — HEPARIN SODIUM (PORCINE) 5000 UNIT/ML IJ SOLN
5000.0000 [IU] | Freq: Three times a day (TID) | INTRAMUSCULAR | Status: DC
  Administered 2015-06-04 (×2): 5000 [IU] via SUBCUTANEOUS
  Filled 2015-06-04 (×2): qty 1

## 2015-06-04 MED ORDER — LORAZEPAM 0.5 MG PO TABS: 0.2500 mg | ORAL_TABLET | Freq: Three times a day (TID) | ORAL | Status: DC | PRN

## 2015-06-04 MED ORDER — ONDANSETRON HCL 4 MG/2ML IJ SOLN: 4.0000 mg | Freq: Four times a day (QID) | INTRAMUSCULAR | Status: DC | PRN

## 2015-06-04 NOTE — Care Management Obs Status (Signed)
MEDICARE OBSERVATION STATUS NOTIFICATION   Patient Details  Name: Autumn Benjamin MRN: [id number] Date of Birth: February 26, 1941   Medicare Observation Status Notification Given:  Yes    Antony Haste, RN 06/04/2015, 9:07 AM

## 2015-06-04 NOTE — Discharge Instructions (Signed)
We ordered Pro-stat for malnutrition - also it would help to have ice cream each night.  We ordered a multivitamin daily.  Follow up with Dr. Elberta Fortis on the 15th.   Regular diet.

## 2015-06-04 NOTE — Progress Notes (Signed)
  Echocardiogram 2D Echocardiogram has been performed.  Autumn Benjamin 06/04/2015, 12:53 PM

## 2015-06-04 NOTE — Progress Notes (Signed)
Pt to discharge via ambulance back to [facility].  Telemetry removed and CCMD notified.  IV removed with catheter intact.  Pt tol well.  Discharge education given to Pt.  Notified Pt that she needed to increase her fluid and food intake or she may continue to have episodes of hypotension and tachycardia.  Pt indicates understanding.  Pt transferred to stretcher.  Pt stable and denies pain, sob or dizziness.  Pt stable at discharge.
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
## 2015-06-04 NOTE — Progress Notes (Signed)
Call placed to [facility] to notify of Pt discharge back to facility.  Report given to nurse.  All questions answered.  Notified Pt would arrive back via ambulance.  Call placed to [facility] to request transport for Pt back to [facility].
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
## 2015-06-06 ENCOUNTER — Encounter: Payer: Self-pay | Admitting: *Deleted

## 2015-06-06 ENCOUNTER — Telehealth: Payer: Self-pay | Admitting: Cardiology

## 2015-06-06 NOTE — Telephone Encounter (Signed)
Returning your call. °

## 2015-06-06 NOTE — Telephone Encounter (Signed)
D/C phone call .Marland Kitchen Appt is on 06/09/15 at 8:30am w/ Dr. Elberta Fortis at the [facility] .  Thanks

## 2015-06-06 NOTE — Telephone Encounter (Signed)
Left message to call back   TCM  DISCHARGE CALL

## 2015-06-06 NOTE — Telephone Encounter (Signed)
Patient contacted regarding discharge from [facility] on 06/04/15.  Patient understands to follow up with provider Dr Elberta Fortis on 06/09/15 at  8:30 am  at [address] office. Patient understands discharge instructions? yes  Patient understands medications and regiment? yes  Patient understands to bring all medications to this visit? YES

## 2015-06-09 ENCOUNTER — Encounter: Payer: Self-pay | Admitting: Cardiology

## 2015-06-09 ENCOUNTER — Ambulatory Visit (INDEPENDENT_AMBULATORY_CARE_PROVIDER_SITE_OTHER): Payer: Medicare Other | Admitting: Cardiology

## 2015-06-09 VITALS — BP 112/76 | HR 104 | Ht 66.0 in | Wt 143.8 lb

## 2015-06-09 DIAGNOSIS — R000 Tachycardia, unspecified: Secondary | ICD-10-CM

## 2015-06-09 DIAGNOSIS — Z950 Presence of cardiac pacemaker: Secondary | ICD-10-CM
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
## 2015-06-09 NOTE — Patient Instructions (Signed)
Medication Instructions:  Your physician recommends that you continue on your current medications as directed. Please refer to the Current Medication list given to you today.  Labwork: None ordered  Testing/Procedures: None ordered  Follow-Up: Your physician recommends that you keep your follow-up appointment on: 08/14/14 at 10:45 a.m. with Dr. Elberta Fortis  If you need a refill on your cardiac medications before your next appointment, please call your pharmacy.  Thank you for choosing [facility]!!   Dory Horn, RN [phone]

## 2015-06-30 ENCOUNTER — Telehealth: Payer: Self-pay | Admitting: Cardiology

## 2015-06-30 NOTE — Telephone Encounter (Signed)
Advised to have PCP follow pt for [facility]. Explained that PPM was placed in November and nothing to follow from electrophysiologist point of view. She will contact PCP

## 2015-06-30 NOTE — Telephone Encounter (Signed)
New message     Patient discharge from [facility] tomorrow.   Will Dr. Elberta Fortis  follow her for home health

## 2015-07-01 ENCOUNTER — Encounter: Payer: Self-pay | Admitting: Cardiology
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
## 2015-08-15 ENCOUNTER — Encounter: Payer: Self-pay | Admitting: Internal Medicine

## 2015-08-15 ENCOUNTER — Encounter: Payer: Self-pay | Admitting: Cardiology

## 2015-08-15 ENCOUNTER — Ambulatory Visit (INDEPENDENT_AMBULATORY_CARE_PROVIDER_SITE_OTHER): Payer: Medicare Other | Admitting: Cardiology

## 2015-08-15 VITALS — BP 130/86 | HR 97 | Ht 67.0 in | Wt 147.6 lb

## 2015-08-15 DIAGNOSIS — R55 Syncope and collapse: Secondary | ICD-10-CM

## 2015-08-15 DIAGNOSIS — Z950 Presence of cardiac pacemaker: Secondary | ICD-10-CM | POA: Diagnosis not present

## 2015-08-15 DIAGNOSIS — Z45018 Encounter for adjustment and management of other part of cardiac pacemaker: Secondary | ICD-10-CM | POA: Diagnosis not present

## 2015-08-15 LAB — CUP PACEART INCLINIC DEVICE CHECK
Date Time Interrogation Session: 20170220110125
Implantable Lead Location: 753860
Implantable Lead Model: 5076
Lead Channel Impedance Value: 448 Ohm
Lead Channel Sensing Intrinsic Amplitude: 17.9 mV
Lead Channel Sensing Intrinsic Amplitude: 3.8 mV
MDC IDC LEAD IMPLANT DT: 20161123
MDC IDC LEAD IMPLANT DT: 20161123
MDC IDC LEAD LOCATION: 753859
MDC IDC MSMT LEADCHNL RA IMPEDANCE VALUE: 370 Ohm
MDC IDC MSMT LEADCHNL RA PACING THRESHOLD AMPLITUDE: 0.8 V
MDC IDC MSMT LEADCHNL RA PACING THRESHOLD PULSEWIDTH: 0.4 ms
MDC IDC MSMT LEADCHNL RV PACING THRESHOLD AMPLITUDE: 1 V
MDC IDC MSMT LEADCHNL RV PACING THRESHOLD PULSEWIDTH: 0.4 ms
MDC IDC PG SERIAL: 68664772
MDC IDC SET LEADCHNL RA PACING AMPLITUDE: 2 V
MDC IDC SET LEADCHNL RV PACING AMPLITUDE: 2.4 V
MDC IDC SET LEADCHNL RV PACING PULSEWIDTH: 0.4 ms
MDC IDC STAT BRADY RA PERCENT PACED: 20 %
MDC IDC STAT BRADY RV PERCENT PACED: 0 %
Pulse Gen Model: 394969

## 2015-08-15 MED ORDER — METOPROLOL TARTRATE 25 MG PO TABS: 25.0000 mg | ORAL_TABLET | Freq: Two times a day (BID) | ORAL | Status: DC

## 2015-08-15 NOTE — Patient Instructions (Addendum)
Medication Instructions:  Your physician has recommended you make the following change in your medication:  1) START Metoprolol 25 mg twice a day  Labwork: None ordered  Testing/Procedures: None ordered  Follow-Up: Remote monitoring is used to monitor your Pacemaker of ICD from home. This monitoring reduces the number of office visits required to check your device to one time per year. It allows [place] to keep an eye on the functioning of your device to ensure it is working properly. You are scheduled for a device check from home on 11/14/2015. You may send your transmission at any time that day. If you have a wireless device, the transmission will be sent automatically. After your physician reviews your transmission, you will receive a postcard with your next transmission date.  Your physician wants you to follow-up in: 9 months with Dr. Elberta Fortis.  You will receive a reminder letter in the mail two months in advance. If you don't receive a letter, please call our office to schedule the follow-up appointment.  If you need a refill on your cardiac medications before your next appointment, please call your pharmacy.  Thank you for choosing [facility]!!   Dory Horn, RN [phone]    Metoprolol tablets What is this medicine? METOPROLOL (me TOE proe lole) is a beta-blocker. Beta-blockers reduce the workload on the heart and help it to beat more regularly. This medicine is used to treat high blood pressure and to prevent chest pain. It is also used to after a heart attack and to prevent an additional heart attack from occurring. This medicine may be used for other purposes; ask your health care provider or pharmacist if you have questions. What should I tell my health care provider before I take this medicine? They need to know if you have any of these conditions: -diabetes -heart or vessel disease like slow heart rate, worsening heart failure, heart block, sick sinus syndrome or  Raynaud's disease -kidney disease -liver disease -lung or breathing disease, like asthma or emphysema -pheochromocytoma -thyroid disease -an unusual or allergic reaction to metoprolol, other beta-blockers, medicines, foods, dyes, or preservatives -pregnant or trying to get pregnant -breast-feeding How should I use this medicine? Take this medicine by mouth with a drink of water. Follow the directions on the prescription label. Take this medicine immediately after meals. Take your doses at regular intervals. Do not take more medicine than directed. Do not stop taking this medicine suddenly. This could lead to serious heart-related effects. Talk to your pediatrician regarding the use of this medicine in children. Special care may be needed. Overdosage: If you think you have taken too much of this medicine contact a poison control center or emergency room at once. NOTE: This medicine is only for you. Do not share this medicine with others. What if I miss a dose? If you miss a dose, take it as soon as you can. If it is almost time for your next dose, take only that dose. Do not take double or extra doses. What may interact with this medicine? This medicine may interact with the following medications: -certain medicines for blood pressure, heart disease, irregular heart beat -certain medicines for depression like monoamine oxidase (MAO) inhibitors, fluoxetine, or paroxetine -clonidine -dobutamine -epinephrine -isoproterenol -reserpine This list may not describe all possible interactions. Give your health care provider a list of all the medicines, herbs, non-prescription drugs, or dietary supplements you use. Also tell them if you smoke, drink alcohol, or use illegal drugs. Some items may interact  with your medicine. What should I watch for while using this medicine? Visit your doctor or health care professional for regular check ups. Contact your doctor right away if your symptoms worsen. Check  your blood pressure and pulse rate regularly. Ask your health care professional what your blood pressure and pulse rate should be, and when you should contact them. You may get drowsy or dizzy. Do not drive, use machinery, or do anything that needs mental alertness until you know how this medicine affects you. Do not sit or stand up quickly, especially if you are an older patient. This reduces the risk of dizzy or fainting spells. Contact your doctor if these symptoms continue. Alcohol may interfere with the effect of this medicine. Avoid alcoholic drinks. What side effects may I notice from receiving this medicine? Side effects that you should report to your doctor or health care professional as soon as possible: -allergic reactions like skin rash, itching or hives -cold or numb hands or feet -depression -difficulty breathing -faint -fever with sore throat -irregular heartbeat, chest pain -rapid weight gain -swollen legs or ankles Side effects that usually do not require medical attention (report to your doctor or health care professional if they continue or are bothersome): -anxiety or nervousness -change in sex drive or performance -dry skin -headache -nightmares or trouble sleeping -short term memory loss -stomach upset or diarrhea -unusually tired This list may not describe all possible side effects. Call your doctor for medical advice about side effects. You may report side effects to FDA at 1-800-FDA-1088. Where should I keep my medicine? Keep out of the reach of children. Store at room temperature between 15 and 30 degrees C (59 and 86 degrees F). Throw away any unused medicine after the expiration date. NOTE: This sheet is a summary. It may not cover all possible information. If you have questions about this medicine, talk to your doctor, pharmacist, or health care provider.    2016, Elsevier/Gold Standard. (2013-02-13 14:40:36)

## 2015-08-26 ENCOUNTER — Telehealth: Payer: Self-pay | Admitting: Cardiology

## 2015-08-26 NOTE — Telephone Encounter (Signed)
Pt wants to know if she can go back to work

## 2015-08-26 NOTE — Telephone Encounter (Signed)
Pt needs to know if she can be released for work in regards to her pacemaker that was placed 04/2015. She has not been released yet from Ortho for her leg fracture.  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  If she is released for work she will need a letter. Pt aware dr/nurse are out of office and will hear next week.

## 2015-08-26 NOTE — Telephone Encounter (Signed)
OK to return to work due to pacemaker.  Needs to be cleared by orthopedic surgery for leg.

## 2015-08-29 NOTE — Telephone Encounter (Signed)
New Message ° °Pt is returning the call.  °

## 2015-08-29 NOTE — Telephone Encounter (Signed)
Informed patient ok to return to work from [employer] standpoint. Advised her she will still need clearance form ortho. Patient verbalized understanding and agreeable to plan.

## 2015-11-14 ENCOUNTER — Ambulatory Visit (INDEPENDENT_AMBULATORY_CARE_PROVIDER_SITE_OTHER): Payer: Medicare Other | Admitting: *Deleted

## 2015-11-14 DIAGNOSIS — R55 Syncope and collapse: Secondary | ICD-10-CM | POA: Diagnosis not present

## 2015-11-14 DIAGNOSIS — I442 Atrioventricular block, complete: Secondary | ICD-10-CM

## 2015-11-14 DIAGNOSIS — Z950 Presence of cardiac pacemaker: Secondary | ICD-10-CM | POA: Diagnosis not present

## 2015-11-14 NOTE — Progress Notes (Signed)
Remote pacemaker transmission.   

## 2015-12-02 LAB — CUP PACEART REMOTE DEVICE CHECK
Implantable Lead Implant Date: 20161123
Implantable Lead Location: 753860
Lead Channel Impedance Value: 351 Ohm
Lead Channel Impedance Value: 390 Ohm
Lead Channel Pacing Threshold Amplitude: 1 V
Lead Channel Pacing Threshold Pulse Width: 0.4 ms
Lead Channel Pacing Threshold Pulse Width: 0.4 ms
Lead Channel Setting Pacing Amplitude: 2 V
MDC IDC LEAD IMPLANT DT: 20161123
MDC IDC LEAD LOCATION: 753859
MDC IDC MSMT LEADCHNL RA PACING THRESHOLD AMPLITUDE: 0.6 V
MDC IDC MSMT LEADCHNL RA SENSING INTR AMPL: 2.5 mV
MDC IDC MSMT LEADCHNL RV SENSING INTR AMPL: 12.5 mV
MDC IDC PG SERIAL: 68664772
MDC IDC SESS DTM: 20170609122104
MDC IDC SET LEADCHNL RV PACING AMPLITUDE: 2.4 V
MDC IDC SET LEADCHNL RV PACING PULSEWIDTH: 0.4 ms
MDC IDC STAT BRADY RA PERCENT PACED: 46 %
MDC IDC STAT BRADY RV PERCENT PACED: 0 %

## 2015-12-08 ENCOUNTER — Encounter: Payer: Self-pay | Admitting: Cardiology

## 2016-02-13 ENCOUNTER — Ambulatory Visit (INDEPENDENT_AMBULATORY_CARE_PROVIDER_SITE_OTHER): Payer: Medicare Other | Admitting: *Deleted

## 2016-02-13 DIAGNOSIS — Z950 Presence of cardiac pacemaker: Secondary | ICD-10-CM

## 2016-02-13 DIAGNOSIS — I442 Atrioventricular block, complete: Secondary | ICD-10-CM

## 2016-02-13 NOTE — Progress Notes (Signed)
Remote pacemaker transmission.   

## 2016-02-15 ENCOUNTER — Encounter: Payer: Self-pay | Admitting: Cardiology

## 2016-02-23 LAB — CUP PACEART INCLINIC DEVICE CHECK
Brady Statistic RA Percent Paced: 46 %
Implantable Lead Implant Date: 20161123
Lead Channel Pacing Threshold Amplitude: 0.7 V
Lead Channel Pacing Threshold Amplitude: 0.9 V
Lead Channel Pacing Threshold Pulse Width: 0.4 ms
Lead Channel Sensing Intrinsic Amplitude: 1.1 mV
Lead Channel Sensing Intrinsic Amplitude: 12.1 mV
MDC IDC LEAD IMPLANT DT: 20161123
MDC IDC LEAD LOCATION: 753859
MDC IDC LEAD LOCATION: 753860
MDC IDC MSMT LEADCHNL RA IMPEDANCE VALUE: 351 Ohm
MDC IDC MSMT LEADCHNL RV IMPEDANCE VALUE: 410 Ohm
MDC IDC MSMT LEADCHNL RV PACING THRESHOLD PULSEWIDTH: 0.4 ms
MDC IDC SESS DTM: 20170831093757
MDC IDC SET LEADCHNL RA PACING AMPLITUDE: 2 V
MDC IDC SET LEADCHNL RV PACING AMPLITUDE: 2.4 V
MDC IDC SET LEADCHNL RV PACING PULSEWIDTH: 0.4 ms
MDC IDC STAT BRADY RV PERCENT PACED: 0 %
Pulse Gen Serial Number: 68664772

## 2016-05-14 ENCOUNTER — Encounter (INDEPENDENT_AMBULATORY_CARE_PROVIDER_SITE_OTHER): Payer: Self-pay

## 2016-05-14 ENCOUNTER — Ambulatory Visit (INDEPENDENT_AMBULATORY_CARE_PROVIDER_SITE_OTHER): Payer: Medicare Other | Admitting: Cardiology

## 2016-05-14 ENCOUNTER — Encounter: Payer: Self-pay | Admitting: Cardiology

## 2016-05-14 VITALS — BP 140/76 | HR 70 | Ht 65.0 in | Wt 165.0 lb

## 2016-05-14 DIAGNOSIS — Z950 Presence of cardiac pacemaker: Secondary | ICD-10-CM

## 2016-05-14 LAB — CUP PACEART INCLINIC DEVICE CHECK
Implantable Lead Implant Date: 20161123
Implantable Lead Location: 753860
Implantable Lead Model: 5076
Implantable Lead Model: 5076
MDC IDC LEAD IMPLANT DT: 20161123
MDC IDC LEAD LOCATION: 753859
MDC IDC PG IMPLANT DT: 20161123
MDC IDC PG SERIAL: 68664772
MDC IDC SESS DTM: 20171120174319

## 2016-05-14 NOTE — Patient Instructions (Addendum)
Medication Instructions:  Your physician recommends that you continue on your current medications as directed. Please refer to the Current Medication list given to you today.  Labwork: None Ordered   Testing/Procedures: None Ordered   Follow-Up: Your physician wants you to follow-up in: 1 year with Dr. Elberta Fortis. You will receive a reminder letter in the mail two months in advance. If you don't receive a letter, please call our office to schedule the follow-up appointment.  Remote monitoring is used to monitor your Pacemaker from home. This monitoring reduces the number of office visits required to check your device to one time per year. It allows [place] to keep an eye on the functioning of your device to ensure it is working properly. You are scheduled for a device check from home on 08/13/16. You may send your transmission at any time that day. If you have a wireless device, the transmission will be sent automatically. After your physician reviews your transmission, you will receive a postcard with your next transmission date.    Any Other Special Instructions Will Be Listed Below (If Applicable).     If you need a refill on your cardiac medications before your next appointment, please call your pharmacy.
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
## 2016-05-15 ENCOUNTER — Encounter: Payer: Medicare Other | Admitting: Cardiology
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
## 2016-07-11 IMAGING — CR DG CHEST 1V
1 series · 1 of 1 positions shown · non-contrast
Comparison: Two-view chest x-ray 05/22/2015.

CLINICAL DATA: Left-sided pneumothorax.  Shortness of breath.

EXAM:
CHEST  1 VIEW

[chest ap]
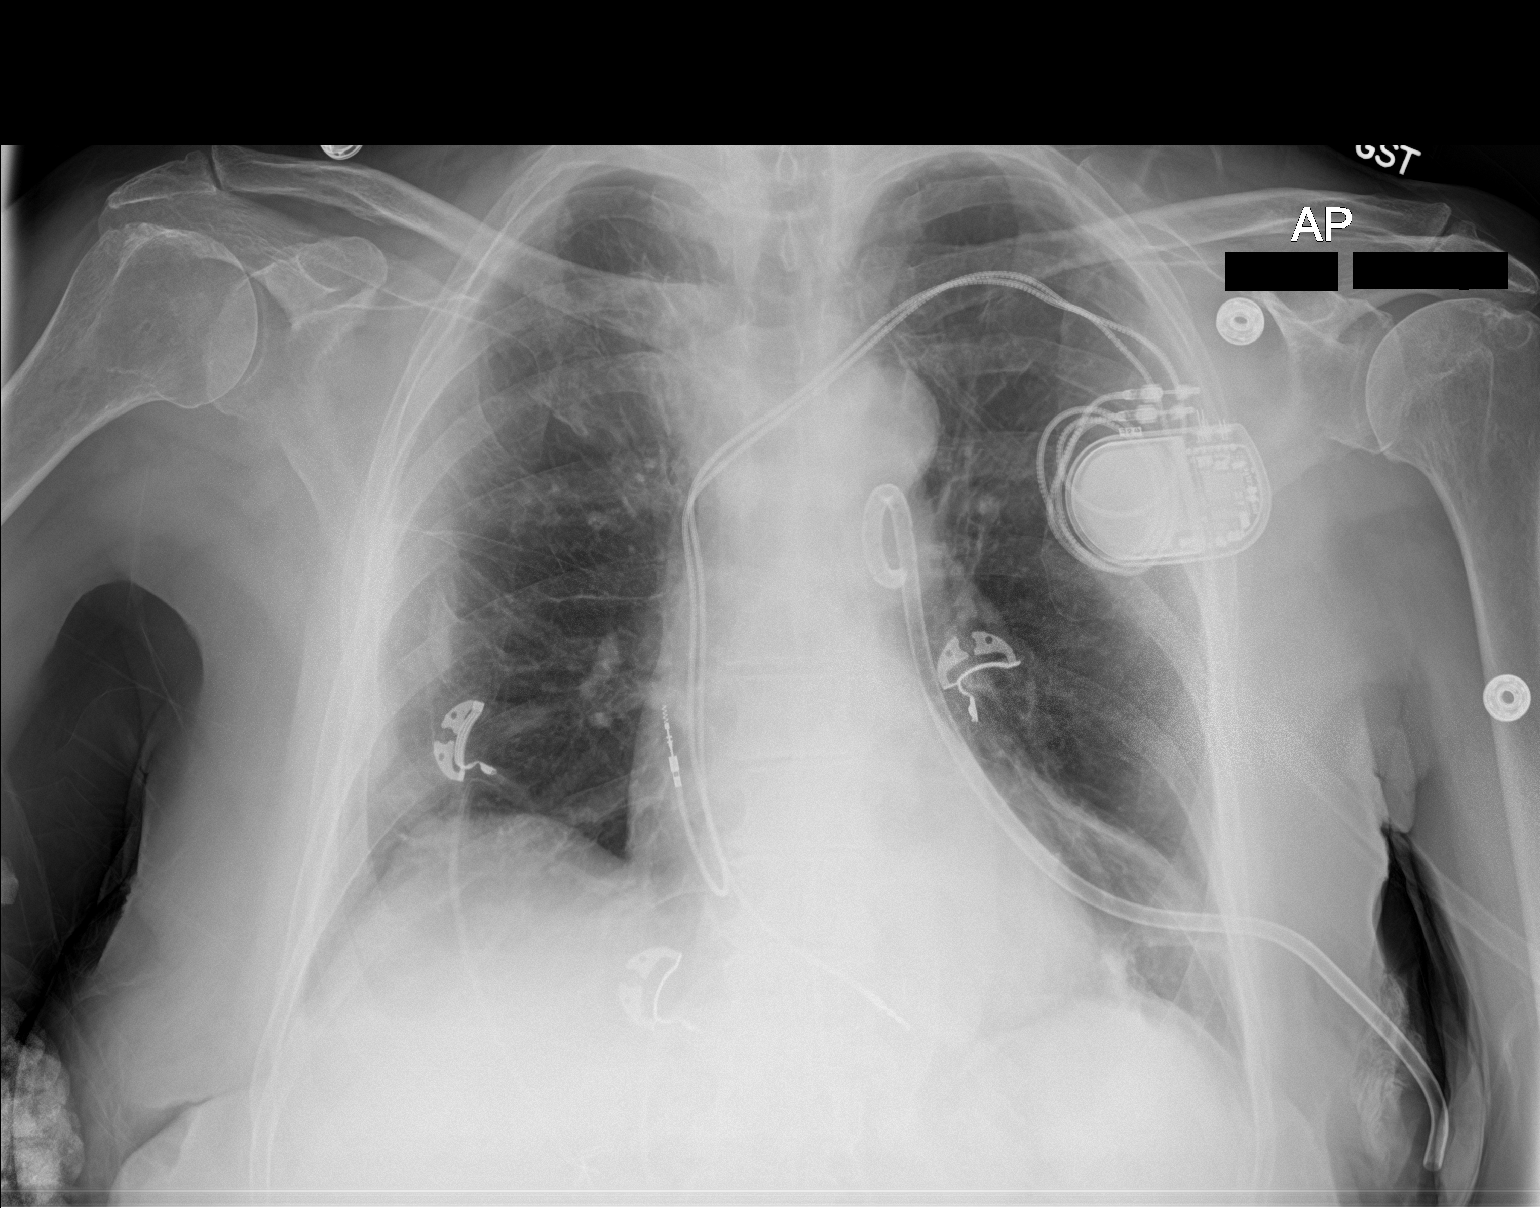

[1 of 1 positions shown; findings below may reference images not displayed]

FINDINGS: The heart size is normal. The left-sided chest tube is stable.
Aeration at the left base is slightly improved. The tiny left apical
pneumothorax remains. Remote right-sided fractures are noted.
IMPRESSION: 1. Stable tiny left apical pneumothorax.  The chest tube is stable.
2. Minimal residual left basilar airspace disease, improving. This
likely reflects atelectasis.

## 2016-07-11 IMAGING — DX DG CHEST 2V
2 series · 2 of 2 positions shown · non-contrast
Comparison: 05/23/2015 and 05/22/2015

CLINICAL DATA: Chest tube removal left lung.

EXAM:
CHEST  2 VIEW

[chest ap]
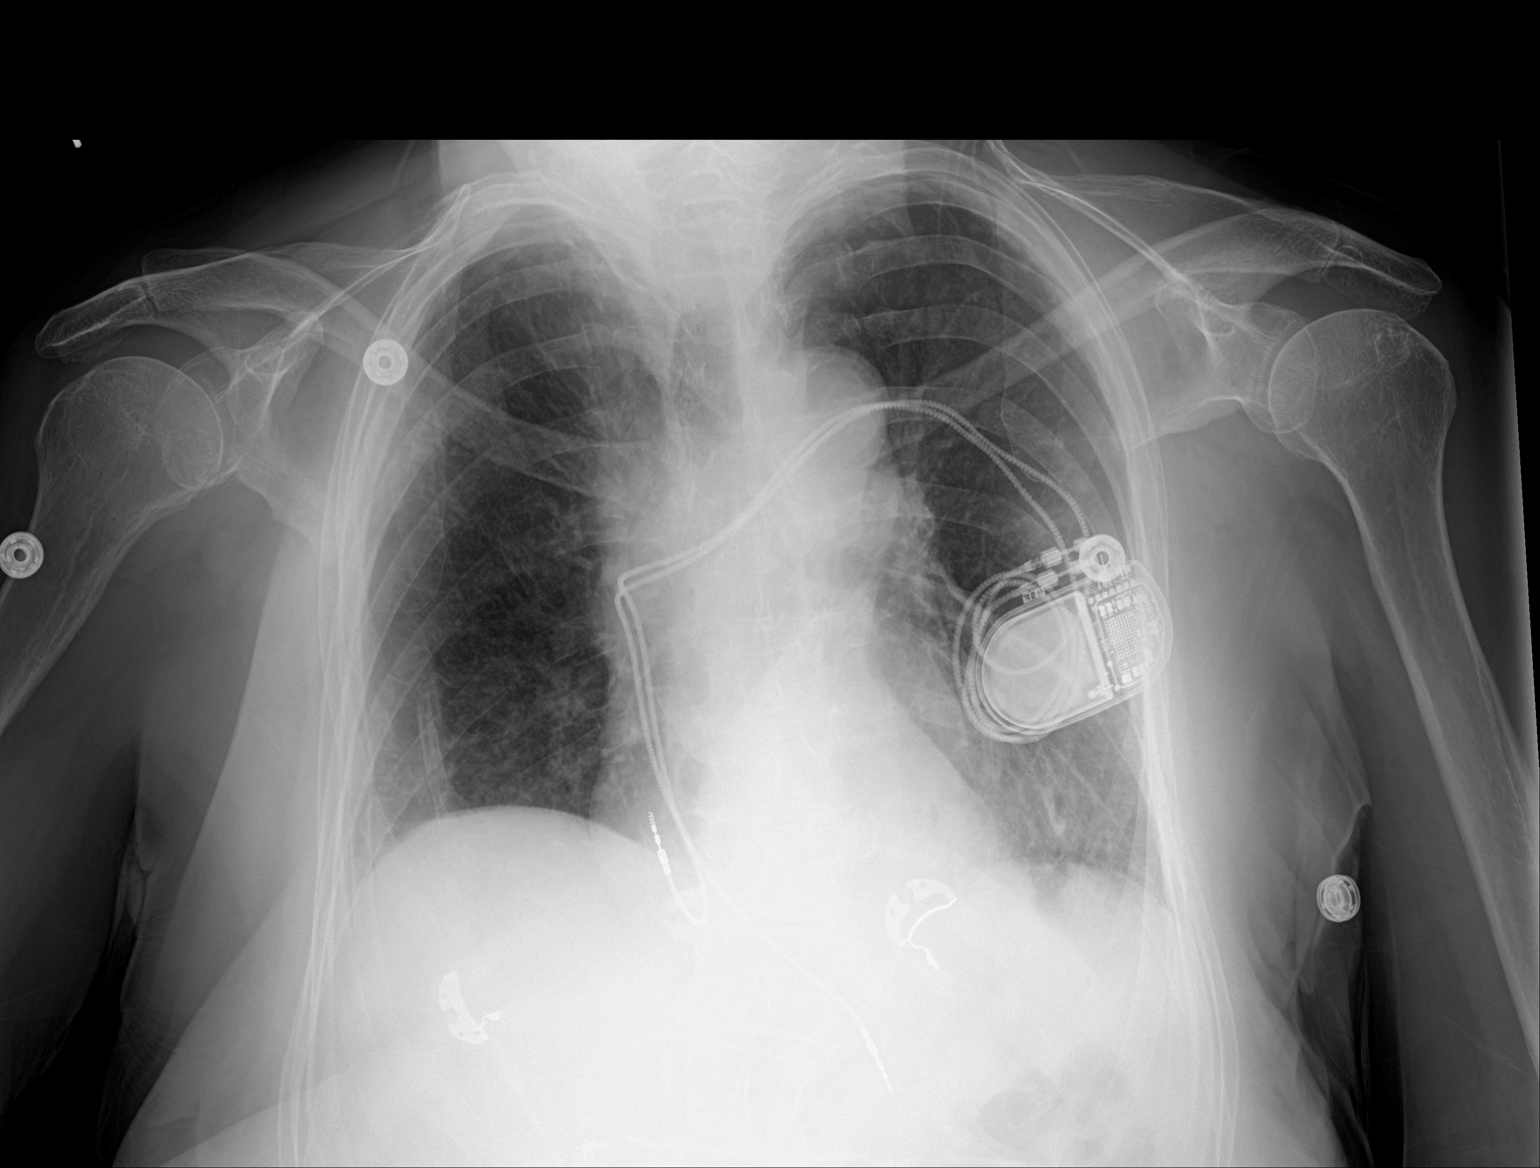

[chest lat]
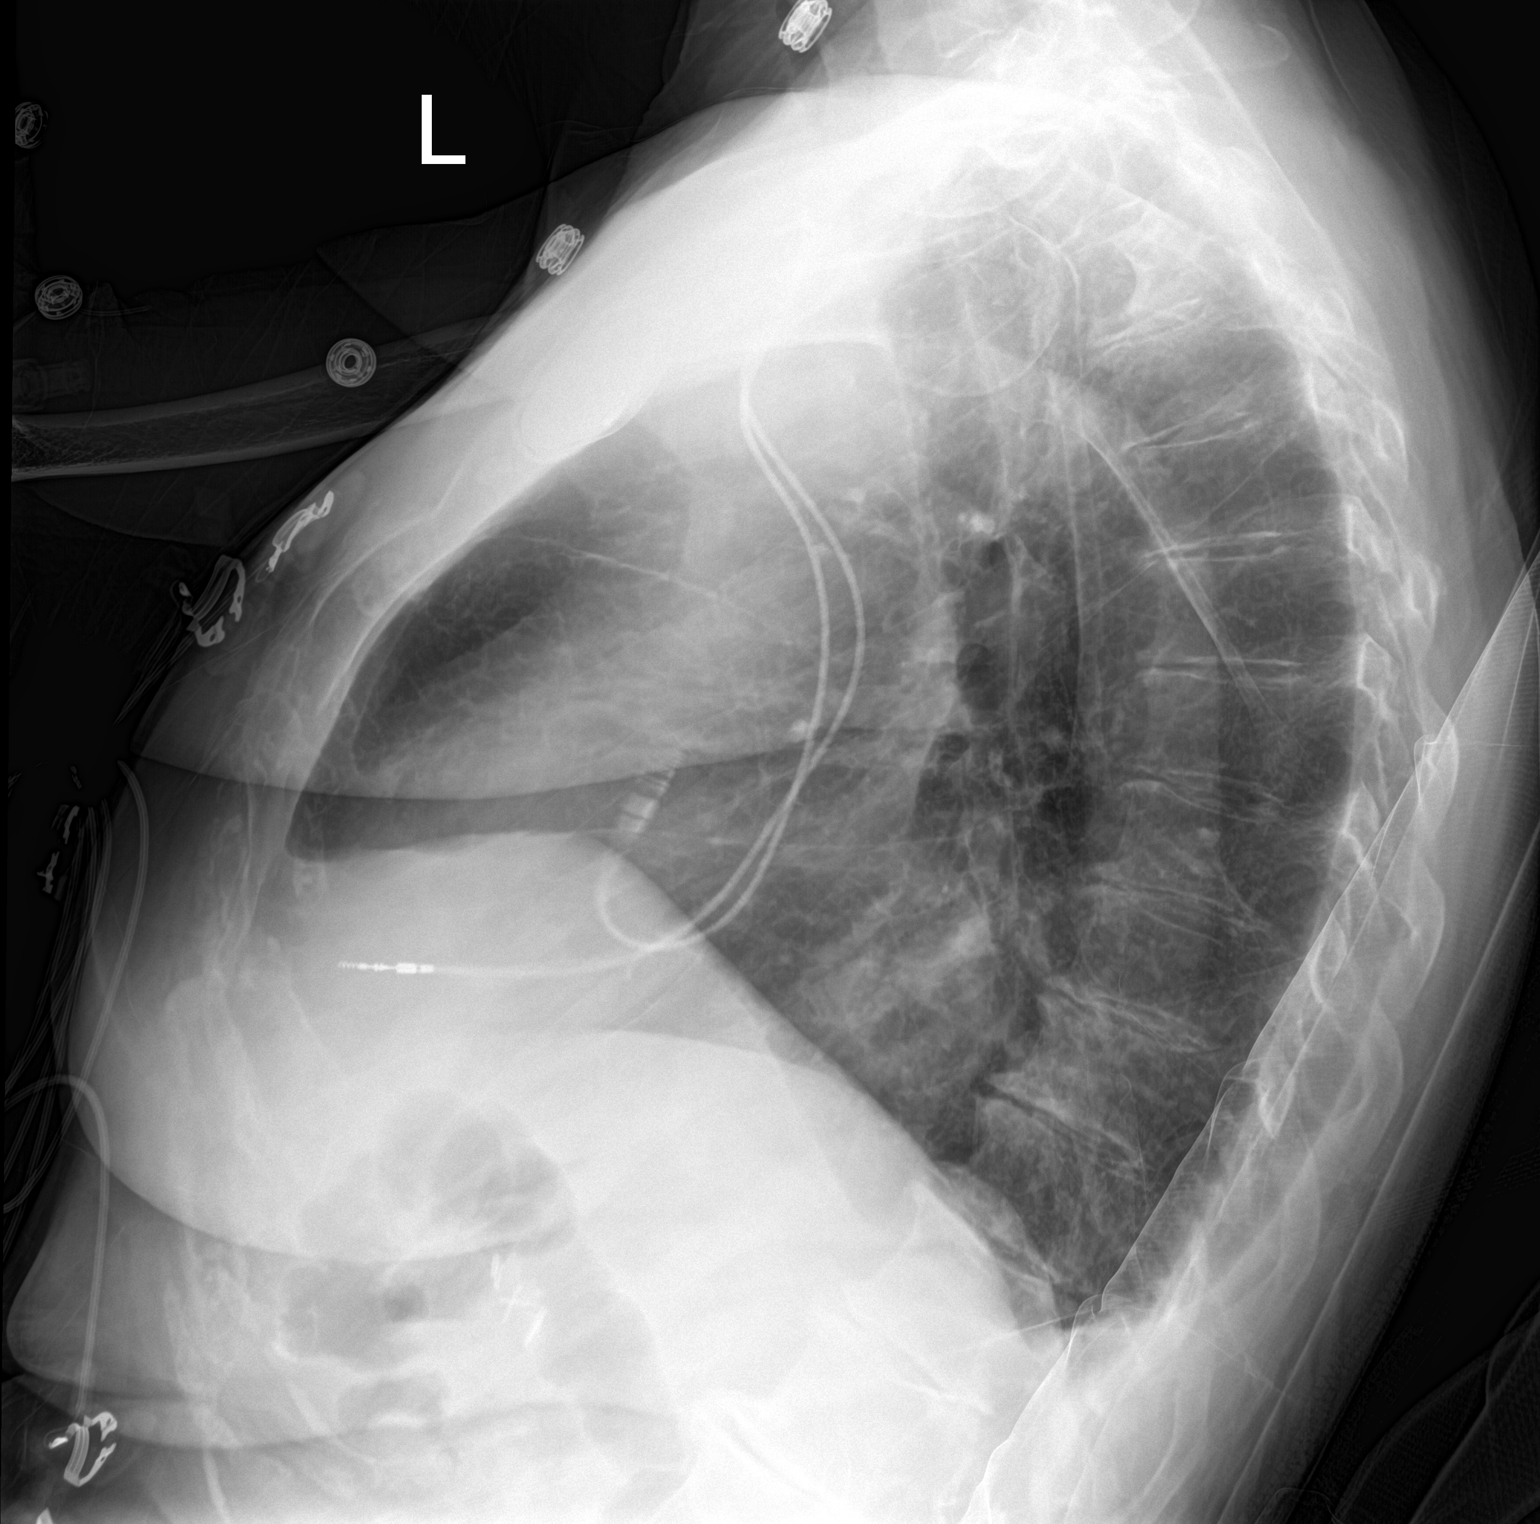

[2 of 2 positions shown; findings below may reference images not displayed]

FINDINGS: Left-sided pacemaker unchanged. Interval removal of left-sided chest
tube. Lungs are somewhat hypoinflated without focal consolidation or
effusion. No evidence of left-sided pneumothorax. Known moderate
hiatal hernia. Cardiomediastinal silhouette and remainder of the
exam is unchanged.
IMPRESSION: No active cardiopulmonary disease. No left-sided pneumothorax post
chest tube removal.

Hiatal hernia.

## 2016-08-07 ENCOUNTER — Ambulatory Visit (HOSPITAL_COMMUNITY)
Admission: RE | Admit: 2016-08-07 | Discharge: 2016-08-07 | Disposition: A | Discharge status: Home / Self Care | Payer: Medicare HMO | Source: Ambulatory Visit | Attending: Physician Assistant | Admitting: Physician Assistant

## 2016-08-07 DIAGNOSIS — D509 Iron deficiency anemia, unspecified: Secondary | ICD-10-CM | POA: Diagnosis not present

## 2016-08-07 LAB — PREPARE RBC (CROSSMATCH)

## 2016-08-07 LAB — ABO/RH: ABO/RH(D): A POS

## 2016-08-07 MED ORDER — SODIUM CHLORIDE 0.9 % IV SOLN
510.0000 mg | INTRAVENOUS | Status: DC
  Administered 2016-08-07: 510 mg via INTRAVENOUS
  Filled 2016-08-07: qty 17

## 2016-08-07 MED ORDER — DIPHENHYDRAMINE HCL 25 MG PO CAPS
ORAL_CAPSULE | ORAL | Status: AC
  Administered 2016-08-07: 25 mg
  Filled 2016-08-07: qty 1

## 2016-08-07 MED ORDER — DIPHENHYDRAMINE HCL 25 MG PO CAPS: 25.0000 mg | ORAL_CAPSULE | Freq: Once | ORAL | Status: DC

## 2016-08-07 MED ORDER — SODIUM CHLORIDE 0.9 % IV SOLN
25.0000 mg | Freq: Once | INTRAVENOUS | Status: DC
  Filled 2016-08-07: qty 0.5

## 2016-08-07 MED ORDER — ACETAMINOPHEN 325 MG PO TABS: 650.0000 mg | ORAL_TABLET | Freq: Once | ORAL | Status: DC

## 2016-08-07 MED ORDER — SODIUM CHLORIDE 0.9 % IV SOLN: Freq: Once | INTRAVENOUS | Status: DC

## 2016-08-07 MED ORDER — IRON DEXTRAN 50 MG/ML IJ SOLN
100.0000 mg | Freq: Once | INTRAMUSCULAR | Status: DC
  Filled 2016-08-07: qty 2

## 2016-08-07 MED ORDER — ACETAMINOPHEN 325 MG PO TABS
ORAL_TABLET | ORAL | Status: AC
  Administered 2016-08-07: 650 mg
  Filled 2016-08-07: qty 2

## 2016-08-07 NOTE — Discharge Instructions (Signed)
Ferumoxytol injection What is this medicine? FERUMOXYTOL is an iron complex. Iron is used to make healthy red blood cells, which carry oxygen and nutrients throughout the body. This medicine is used to treat iron deficiency anemia in people with chronic kidney disease. COMMON BRAND NAME(S): Feraheme What should I tell my health care provider before I take this medicine? They need to know if you have any of these conditions: -anemia not caused by low iron levels -high levels of iron in the blood -magnetic resonance imaging (MRI) test scheduled -an unusual or allergic reaction to iron, other medicines, foods, dyes, or preservatives -pregnant or trying to get pregnant -breast-feeding How should I use this medicine? This medicine is for injection into a vein. It is given by a health care professional in a hospital or clinic setting. Talk to your pediatrician regarding the use of this medicine in children. Special care may be needed. What if I miss a dose? It is important not to miss your dose. Call your doctor or health care professional if you are unable to keep an appointment. What may interact with this medicine? This medicine may interact with the following medications: -other iron products What should I watch for while using this medicine? Visit your doctor or healthcare professional regularly. Tell your doctor or healthcare professional if your symptoms do not start to get better or if they get worse. You may need blood work done while you are taking this medicine. You may need to follow a special diet. Talk to your doctor. Foods that contain iron include: whole grains/cereals, dried fruits, beans, or peas, leafy green vegetables, and organ meats (liver, kidney). What side effects may I notice from receiving this medicine? Side effects that you should report to your doctor or health care professional as soon as possible: -allergic reactions like skin rash, itching or hives, swelling of the  face, lips, or tongue -breathing problems -changes in blood pressure -feeling faint or lightheaded, falls -fever or chills -flushing, sweating, or hot feelings -swelling of the ankles or feet Side effects that usually do not require medical attention (report to your doctor or health care professional if they continue or are bothersome): -diarrhea -headache -nausea, vomiting -stomach pain Where should I keep my medicine? This drug is given in a hospital or clinic and will not be stored at home.  2017 Elsevier/Gold Standard (2015-07-14 12:41:49) Blood Transfusion , Adult A blood transfusion is a procedure in which you receive donated blood, including plasma, platelets, and red blood cells, through an IV tube. You may need a blood transfusion because of illness, surgery, or injury. The blood may come from a donor. You may also be able to donate blood for yourself (autologous blood donation) before a surgery if you know that you might require a blood transfusion. The blood given in a transfusion is made up of different types of cells. You may receive:  Red blood cells. These carry oxygen to the cells in the body.  White blood cells. These help you fight infections.  Platelets. These help your blood to clot.  Plasma. This is the liquid part of your blood and it helps with fluid imbalances. If you have hemophilia or another clotting disorder, you may also receive other types of blood products. Tell a health care provider about:  Any allergies you have.  All medicines you are taking, including vitamins, herbs, eye drops, creams, and over-the-counter medicines.  Any problems you or family members have had with anesthetic medicines.  Any blood  disorders you have.  Any surgeries you have had.  Any medical conditions you have, including any recent fever or cold symptoms.  Whether you are pregnant or may be pregnant.  Any previous reactions you have had during a blood  transfusion. What are the risks? Generally, this is a safe procedure. However, problems may occur, including:  Having an allergic reaction to something in the donated blood. Hives and itching may be symptoms of this type of reaction.  Fever. This may be a reaction to the white blood cells in the transfused blood. Nausea or chest pain may accompany a fever.  Iron overload. This can happen from having many transfusions.  Transfusion-related acute lung injury (TRALI). This is a rare reaction that causes lung damage. The cause is not known.TRALI can occur within hours of a transfusion or several days later.  Sudden (acute) or delayed hemolytic reactions. This happens if your blood does not match the cells in your transfusion. Your bodys defense system (immune system) may try to attack the new cells. This complication is rare. The symptoms include fever, chills, nausea, and low back pain or chest pain.  Infection or disease transmission. This is rare. What happens before the procedure?  You will have a blood test to determine your blood type. This is necessary to know what kind of blood your body will accept and to match it to the donor blood.  If you are going to have a planned surgery, you may be able to do an autologous blood donation. This may be done in case you need to have a transfusion.  If you have had an allergic reaction to a transfusion in the past, you may be given medicine to help prevent a reaction. This medicine may be given to you by mouth or through an IV tube.  You will have your temperature, blood pressure, and pulse monitored before the transfusion.  Follow instructions from your health care provider about eating and drinking restrictions.  Ask your health care provider about:  Changing or stopping your regular medicines. This is especially important if you are taking diabetes medicines or blood thinners.  Taking medicines such as aspirin and ibuprofen. These  medicines can thin your blood. Do not take these medicines before your procedure if your health care provider instructs you not to. What happens during the procedure?  An IV tube will be inserted into one of your veins.  The bag of donated blood will be attached to your IV tube. The blood will then enter through your vein.  Your temperature, blood pressure, and pulse will be monitored regularly during the transfusion. This monitoring is done to detect early signs of a transfusion reaction.  If you have any signs or symptoms of a reaction, your transfusion will be stopped and you may be given medicine.  When the transfusion is complete, your IV tube will be removed.  Pressure may be applied to the IV site for a few minutes.  A bandage (dressing) will be applied. The procedure may vary among health care providers and hospitals. What happens after the procedure?  Your temperature, blood pressure, heart rate, breathing rate, and blood oxygen level will be monitored often.  Your blood may be tested to see how you are responding to the transfusion.  You may be warmed with fluids or blankets to maintain a normal body temperature. Summary  A blood transfusion is a procedure in which you receive donated blood, including plasma, platelets, and red blood cells, through an  IV tube.  Your temperature, blood pressure, and pulse will be monitored before, during, and after the transfusion.  Your blood may be tested after the transfusion to see how your body has responded. This information is not intended to replace advice given to you by your health care provider. Make sure you discuss any questions you have with your health care provider. Document Released: 06/08/2000 Document Revised: 03/08/2016 Document Reviewed: 03/08/2016 Elsevier Interactive Patient Education  2017 Elsevier Inc. Iron Dextran injection What is this medicine? IRON DEXTRAN (AHY ern DEX tran) is an iron complex. Iron is used  to make healthy red blood cells, which carry oxygen and nutrients through the body. This medicine is used to treat people who cannot take iron by mouth and have low levels of iron in the blood. This medicine may be used for other purposes; ask your health care provider or pharmacist if you have questions. COMMON BRAND NAME(S): Dexferrum, INFeD What should I tell my health care provider before I take this medicine? They need to know if you have any of these conditions: -anemia not caused by low iron levels -heart disease -high levels of iron in the blood -kidney disease -liver disease -an unusual or allergic reaction to iron, other medicines, foods, dyes, or preservatives -pregnant or trying to get pregnant -breast-feeding How should I use this medicine? This medicine is for injection into a vein or a muscle. It is given by a health care professional in a hospital or clinic setting. Talk to your pediatrician regarding the use of this medicine in children. While this drug may be prescribed for children as young as 4 months old for selected conditions, precautions do apply. Overdosage: If you think you have taken too much of this medicine contact a poison control center or emergency room at once. NOTE: This medicine is only for you. Do not share this medicine with others. What if I miss a dose? It is important not to miss your dose. Call your doctor or health care professional if you are unable to keep an appointment. What may interact with this medicine? Do not take this medicine with any of the following medications: -deferoxamine -dimercaprol -other iron products This medicine may also interact with the following medications: -chloramphenicol -deferasirox This list may not describe all possible interactions. Give your health care provider a list of all the medicines, herbs, non-prescription drugs, or dietary supplements you use. Also tell them if you smoke, drink alcohol, or use illegal  drugs. Some items may interact with your medicine. What should I watch for while using this medicine? Visit your doctor or health care professional regularly. Tell your doctor if your symptoms do not start to get better or if they get worse. You may need blood work done while you are taking this medicine. You may need to follow a special diet. Talk to your doctor. Foods that contain iron include: whole grains/cereals, dried fruits, beans, or peas, leafy green vegetables, and organ meats (liver, kidney). Long-term use of this medicine may increase your risk of some cancers. Talk to your doctor about how to limit your risk. What side effects may I notice from receiving this medicine? Side effects that you should report to your doctor or health care professional as soon as possible: -allergic reactions like skin rash, itching or hives, swelling of the face, lips, or tongue -blue lips, nails, or skin -breathing problems -changes in blood pressure -chest pain -confusion -fast, irregular heartbeat -feeling faint or lightheaded, falls -fever or chills -  flushing, sweating, or hot feelings -joint or muscle aches or pains -pain, tingling, numbness in the hands or feet -seizures -unusually weak or tired Side effects that usually do not require medical attention (report to your doctor or health care professional if they continue or are bothersome): -change in taste (metallic taste) -diarrhea -headache -irritation at site where injected -nausea, vomiting -stomach upset This list may not describe all possible side effects. Call your doctor for medical advice about side effects. You may report side effects to FDA at 1-800-FDA-1088. Where should I keep my medicine? This drug is given in a hospital or clinic and will not be stored at home. NOTE: This sheet is a summary. It may not cover all possible information. If you have questions about this medicine, talk to your doctor, pharmacist, or health care  provider.  2017 Elsevier/Gold Standard (2007-10-28 16:59:50)

## 2016-08-08 LAB — TYPE AND SCREEN
BLOOD PRODUCT EXPIRATION DATE: 201802272359
Blood Product Expiration Date: 201802252359
ISSUE DATE / TIME: 201802131001
ISSUE DATE / TIME: 201802131228
UNIT TYPE AND RH: 6200
Unit Type and Rh: 6200

## 2016-08-13 ENCOUNTER — Telehealth: Payer: Self-pay | Admitting: Internal Medicine

## 2016-08-13 ENCOUNTER — Ambulatory Visit (INDEPENDENT_AMBULATORY_CARE_PROVIDER_SITE_OTHER): Payer: Medicare HMO | Admitting: *Deleted

## 2016-08-13 DIAGNOSIS — I442 Atrioventricular block, complete: Secondary | ICD-10-CM | POA: Diagnosis not present

## 2016-08-13 NOTE — Telephone Encounter (Signed)
Informed pt that her remote transmission is automatic and it was received. Pt verbalized understanding.

## 2016-08-13 NOTE — Telephone Encounter (Signed)
New message    Patient needs clarification  on what to do.   1. Has your device fired? No   2. Is you device beeping? No   3. Are you experiencing draining or swelling at device site? No   4. Are you calling to see if we received your device transmission? Yes   5. Have you passed out? No

## 2016-08-14 ENCOUNTER — Encounter: Payer: Self-pay | Admitting: Cardiology

## 2016-08-14 ENCOUNTER — Other Ambulatory Visit (HOSPITAL_COMMUNITY): Payer: Self-pay | Admitting: *Deleted

## 2016-08-14 LAB — CUP PACEART REMOTE DEVICE CHECK
Brady Statistic RV Percent Paced: 0 %
Implantable Lead Implant Date: 20161123
Implantable Lead Location: 753860
Implantable Lead Model: 5076
Implantable Lead Model: 5076
Lead Channel Pacing Threshold Amplitude: 0.7 V
Lead Channel Pacing Threshold Amplitude: 0.8 V
Lead Channel Pacing Threshold Pulse Width: 0.4 ms
Lead Channel Sensing Intrinsic Amplitude: 13 mV
MDC IDC LEAD IMPLANT DT: 20161123
MDC IDC LEAD LOCATION: 753859
MDC IDC MSMT LEADCHNL RA IMPEDANCE VALUE: 351 Ohm
MDC IDC MSMT LEADCHNL RA PACING THRESHOLD PULSEWIDTH: 0.4 ms
MDC IDC MSMT LEADCHNL RA SENSING INTR AMPL: 1.5 mV
MDC IDC MSMT LEADCHNL RV IMPEDANCE VALUE: 390 Ohm
MDC IDC PG IMPLANT DT: 20161123
MDC IDC PG SERIAL: 68664772
MDC IDC SESS DTM: 20180220152625
MDC IDC STAT BRADY RA PERCENT PACED: 49 %

## 2016-08-14 NOTE — Progress Notes (Signed)
Remote pacemaker transmission.   

## 2016-08-15 ENCOUNTER — Ambulatory Visit (HOSPITAL_COMMUNITY)
Admission: RE | Admit: 2016-08-15 | Discharge: 2016-08-15 | Disposition: A | Discharge status: Home / Self Care | Payer: Medicare HMO | Source: Ambulatory Visit | Attending: Physician Assistant | Admitting: Physician Assistant

## 2016-08-15 DIAGNOSIS — D509 Iron deficiency anemia, unspecified: Secondary | ICD-10-CM | POA: Diagnosis not present

## 2016-08-15 MED ORDER — FERUMOXYTOL INJECTION 510 MG/17 ML
510.0000 mg | INTRAVENOUS | Status: AC
  Administered 2016-08-15: 510 mg via INTRAVENOUS
  Filled 2016-08-15 (×2): qty 17

## 2016-09-06 ENCOUNTER — Other Ambulatory Visit: Payer: Self-pay

## 2016-09-06 DIAGNOSIS — R928 Other abnormal and inconclusive findings on diagnostic imaging of breast: Secondary | ICD-10-CM

## 2016-09-13 ENCOUNTER — Other Ambulatory Visit: Payer: Self-pay | Admitting: Family

## 2016-09-14 ENCOUNTER — Other Ambulatory Visit: Payer: Self-pay | Admitting: Family

## 2016-09-14 DIAGNOSIS — N6489 Other specified disorders of breast: Secondary | ICD-10-CM

## 2016-09-19 ENCOUNTER — Ambulatory Visit
Admission: RE | Admit: 2016-09-19 | Discharge: 2016-09-19 | Disposition: A | Discharge status: Home / Self Care | Payer: Medicare HMO | Source: Ambulatory Visit | Attending: Family | Admitting: Family

## 2016-09-19 DIAGNOSIS — N6489 Other specified disorders of breast: Secondary | ICD-10-CM

## 2016-09-19 HISTORY — PX: BREAST BIOPSY: SHX20

## 2016-11-12 ENCOUNTER — Ambulatory Visit (INDEPENDENT_AMBULATORY_CARE_PROVIDER_SITE_OTHER): Payer: Medicare HMO | Admitting: *Deleted

## 2016-11-12 DIAGNOSIS — I442 Atrioventricular block, complete: Secondary | ICD-10-CM | POA: Diagnosis not present

## 2016-11-13 ENCOUNTER — Ambulatory Visit: Payer: Medicare HMO | Attending: Orthopedic Surgery | Admitting: Rehabilitation

## 2016-11-13 ENCOUNTER — Encounter: Payer: Self-pay | Admitting: Rehabilitation

## 2016-11-13 DIAGNOSIS — M25551 Pain in right hip: Secondary | ICD-10-CM

## 2016-11-13 DIAGNOSIS — R262 Difficulty in walking, not elsewhere classified: Secondary | ICD-10-CM | POA: Diagnosis present
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
## 2016-11-14 NOTE — Progress Notes (Signed)
Remote pacemaker transmission.   

## 2016-11-15 LAB — CUP PACEART INCLINIC DEVICE CHECK
Date Time Interrogation Session: 20180524095330
Implantable Lead Implant Date: 20161123
Implantable Lead Implant Date: 20161123
Implantable Lead Location: 753859
Implantable Lead Model: 5076
Implantable Lead Model: 5076
Implantable Pulse Generator Implant Date: 20161123
Lead Channel Impedance Value: 429 Ohm
Lead Channel Pacing Threshold Amplitude: 0.7 V
Lead Channel Pacing Threshold Amplitude: 0.8 V
Lead Channel Pacing Threshold Pulse Width: 0.4 ms
Lead Channel Pacing Threshold Pulse Width: 0.4 ms
Lead Channel Sensing Intrinsic Amplitude: 13 mV
MDC IDC LEAD LOCATION: 753860
MDC IDC MSMT LEADCHNL RA IMPEDANCE VALUE: 351 Ohm
MDC IDC MSMT LEADCHNL RA SENSING INTR AMPL: 2.6 mV
MDC IDC STAT BRADY RA PERCENT PACED: 52 %
MDC IDC STAT BRADY RV PERCENT PACED: 0 %
Pulse Gen Serial Number: 68664772

## 2016-11-16 ENCOUNTER — Encounter: Payer: Self-pay | Admitting: Physical Therapy

## 2016-11-16 ENCOUNTER — Ambulatory Visit: Payer: Medicare HMO | Admitting: Physical Therapy

## 2016-11-16 DIAGNOSIS — R262 Difficulty in walking, not elsewhere classified: Secondary | ICD-10-CM

## 2016-11-16 DIAGNOSIS — M25551 Pain in right hip: Secondary | ICD-10-CM
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
## 2016-11-20 ENCOUNTER — Ambulatory Visit: Payer: Medicare HMO | Admitting: Physical Therapy

## 2016-11-22 ENCOUNTER — Ambulatory Visit: Payer: Medicare HMO | Admitting: Physical Therapy

## 2017-01-01 ENCOUNTER — Telehealth: Payer: Self-pay

## 2017-01-01 ENCOUNTER — Telehealth: Payer: Self-pay | Admitting: Cardiology

## 2017-01-01 NOTE — Telephone Encounter (Signed)
LVM for patient call back - need to schedule for an earlier DC appointment to assess site per WC.

## 2017-01-01 NOTE — Telephone Encounter (Signed)
Re-scheduled patient for 7/17 at 9am.

## 2017-01-01 NOTE — Telephone Encounter (Signed)
New message   1. Has your device fired? no  2. Is you device beeping? no  3. Are you experiencing draining or swelling at device site? no  4. Are you calling to see if we received your device transmission? no  5. Have you passed out? No  Pt states she is having pain where her device is placed. Pt would like to speak with RN about her pain. Please call back to discuss

## 2017-01-01 NOTE — Telephone Encounter (Signed)
Spoke with patient who reports intermittent aching pain at incision and left arm with tenderness when she presses on her device. She states that at worst its a 7/10 pain. She denies fever, chills, redness or swelling. She reports that this began roughly one month ago and is unrelieved by oral pain medication. I offered a same day appointment which she declined. I additionally offered an appointment for tomorrow afternoon but she again declined. She requested our first available morning appointment which will be 7/25. Patient agreeable to this.

## 2017-01-01 NOTE — Telephone Encounter (Signed)
Patient additionally denied any forceful trauma to site.

## 2017-01-01 NOTE — Telephone Encounter (Signed)
Should probably be seen earlier to ensure there is no infection of the device.

## 2017-01-08 ENCOUNTER — Ambulatory Visit (INDEPENDENT_AMBULATORY_CARE_PROVIDER_SITE_OTHER): Payer: Self-pay | Admitting: *Deleted

## 2017-01-08 DIAGNOSIS — Z950 Presence of cardiac pacemaker: Secondary | ICD-10-CM

## 2017-01-08 DIAGNOSIS — I442 Atrioventricular block, complete: Secondary | ICD-10-CM

## 2017-01-08 NOTE — Progress Notes (Signed)
Patient seen today for c/o intermittent dull and achy pain at site of pacemaker that reaches a 7/10 pain and began roughly a month ago. Upon assessment incision free of redness, swelling, drainage or tethering. Site sore to touch. GT assessed and recommended that patient try over the counter pain medication and for patient to call with any s/s of infection. Patient verbalized understanding. Recall for WC in 04/2017

## 2017-02-11 ENCOUNTER — Other Ambulatory Visit: Payer: Self-pay | Admitting: Family

## 2017-02-11 DIAGNOSIS — N6489 Other specified disorders of breast: Secondary | ICD-10-CM

## 2017-02-12 ENCOUNTER — Ambulatory Visit (INDEPENDENT_AMBULATORY_CARE_PROVIDER_SITE_OTHER): Payer: Medicare HMO | Admitting: *Deleted

## 2017-02-12 DIAGNOSIS — I442 Atrioventricular block, complete: Secondary | ICD-10-CM

## 2017-02-13 NOTE — Progress Notes (Signed)
Remote pacemaker transmission.   

## 2017-02-22 ENCOUNTER — Encounter: Payer: Self-pay | Admitting: Cardiology

## 2017-02-22 NOTE — Progress Notes (Signed)
Letter  

## 2017-03-25 ENCOUNTER — Other Ambulatory Visit: Payer: Self-pay | Admitting: Family

## 2017-03-25 ENCOUNTER — Ambulatory Visit
Admission: RE | Admit: 2017-03-25 | Discharge: 2017-03-25 | Disposition: A | Discharge status: Home / Self Care | Payer: Medicare HMO | Source: Ambulatory Visit | Attending: Family | Admitting: Family

## 2017-03-25 DIAGNOSIS — N6489 Other specified disorders of breast: Secondary | ICD-10-CM

## 2017-04-02 LAB — CUP PACEART REMOTE DEVICE CHECK
Brady Statistic AP VP Percent: 0 %
Brady Statistic RA Percent Paced: 62 %
Brady Statistic RV Percent Paced: 0 %
Date Time Interrogation Session: 20181009050535
Implantable Lead Implant Date: 20161123
Implantable Lead Location: 753859
Implantable Lead Location: 753860
Implantable Lead Model: 5076
Implantable Pulse Generator Implant Date: 20161123
Lead Channel Impedance Value: 359 Ohm
Lead Channel Pacing Threshold Pulse Width: 0.4 ms
Lead Channel Setting Pacing Amplitude: 2 V
Lead Channel Setting Pacing Pulse Width: 0.4 ms
MDC IDC LEAD IMPLANT DT: 20161123
MDC IDC MSMT LEADCHNL RA PACING THRESHOLD AMPLITUDE: 0.7 V
MDC IDC MSMT LEADCHNL RA PACING THRESHOLD PULSEWIDTH: 0.4 ms
MDC IDC MSMT LEADCHNL RV IMPEDANCE VALUE: 416 Ohm
MDC IDC MSMT LEADCHNL RV PACING THRESHOLD AMPLITUDE: 0.9 V
MDC IDC SET LEADCHNL RA PACING AMPLITUDE: 2 V
MDC IDC STAT BRADY AP VS PERCENT: 62 %
MDC IDC STAT BRADY AS VP PERCENT: 0 %
MDC IDC STAT BRADY AS VS PERCENT: 37 %
Pulse Gen Model: 394969
Pulse Gen Serial Number: 68664772

## 2017-05-14 ENCOUNTER — Ambulatory Visit (INDEPENDENT_AMBULATORY_CARE_PROVIDER_SITE_OTHER): Payer: Medicare HMO | Admitting: *Deleted

## 2017-05-14 DIAGNOSIS — I442 Atrioventricular block, complete: Secondary | ICD-10-CM | POA: Diagnosis not present

## 2017-05-15 ENCOUNTER — Ambulatory Visit: Payer: Medicare HMO | Admitting: Cardiology

## 2017-05-15 ENCOUNTER — Encounter (INDEPENDENT_AMBULATORY_CARE_PROVIDER_SITE_OTHER): Payer: Self-pay

## 2017-05-15 ENCOUNTER — Encounter: Payer: Self-pay | Admitting: Cardiology

## 2017-05-15 VITALS — BP 138/80 | HR 77 | Ht 66.0 in | Wt 179.8 lb

## 2017-05-15 DIAGNOSIS — I471 Supraventricular tachycardia: Secondary | ICD-10-CM

## 2017-05-15 DIAGNOSIS — I442 Atrioventricular block, complete: Secondary | ICD-10-CM | POA: Diagnosis not present

## 2017-05-15 MED ORDER — METOPROLOL TARTRATE 50 MG PO TABS: 50.0000 mg | ORAL_TABLET | Freq: Two times a day (BID) | ORAL | 3 refills | Status: DC
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
## 2017-05-15 NOTE — Patient Instructions (Signed)
Medication Instructions:  Your physician has recommended you make the following change in your medication: 1. INCREASE METOPROLOL TARTRATE (Lopressor) to 50 mg twice daily  *If you need a refill on your cardiac medications before your next appointment, please call your pharmacy*  Labwork: None ordered  Testing/Procedures: None ordered  Follow-Up: Remote monitoring is used to monitor your Pacemaker or ICD from home. This monitoring reduces the number of office visits required to check your device to one time per year. It allows [place] to keep an eye on the functioning of your device to ensure it is working properly. You are scheduled for a device check from home on 08/14/2017. You may send your transmission at any time that day. If you have a wireless device, the transmission will be sent automatically. After your physician reviews your transmission, you will receive a postcard with your next transmission date.  Your physician wants you to follow-up in: 1 year with Dr. Elberta Fortis.  You will receive a reminder letter in the mail two months in advance. If you don't receive a letter, please call our office to schedule the follow-up appointment.  Thank you for choosing [facility]!!   Dory Horn, RN [phone]

## 2017-05-15 NOTE — Progress Notes (Signed)
Remote pacemaker transmission.   

## 2017-05-21 LAB — CUP PACEART REMOTE DEVICE CHECK
Date Time Interrogation Session: 20181127094857
Implantable Lead Implant Date: 20161123
Implantable Lead Implant Date: 20161123
Implantable Lead Location: 753859
Implantable Lead Location: 753860
Implantable Lead Model: 5076
Implantable Lead Model: 5076
Implantable Pulse Generator Implant Date: 20161123
Pulse Gen Serial Number: 68664772

## 2017-05-23 ENCOUNTER — Encounter: Payer: Self-pay | Admitting: Cardiology

## 2017-05-28 LAB — CUP PACEART INCLINIC DEVICE CHECK
Date Time Interrogation Session: 20181204132927
Implantable Lead Implant Date: 20161123
Implantable Lead Location: 753860
Implantable Lead Model: 5076
Implantable Pulse Generator Implant Date: 20161123
Lead Channel Setting Pacing Amplitude: 2 V
MDC IDC LEAD IMPLANT DT: 20161123
MDC IDC LEAD LOCATION: 753859
MDC IDC SET LEADCHNL RA PACING AMPLITUDE: 2 V
MDC IDC SET LEADCHNL RV PACING PULSEWIDTH: 0.4 ms
Pulse Gen Model: 394969
Pulse Gen Serial Number: 68664772

## 2017-08-13 ENCOUNTER — Ambulatory Visit (INDEPENDENT_AMBULATORY_CARE_PROVIDER_SITE_OTHER): Payer: Medicare HMO | Admitting: *Deleted

## 2017-08-13 DIAGNOSIS — I442 Atrioventricular block, complete: Secondary | ICD-10-CM | POA: Diagnosis not present

## 2017-08-13 NOTE — Progress Notes (Signed)
Remote pacemaker transmission.   

## 2017-08-14 LAB — CUP PACEART REMOTE DEVICE CHECK
Brady Statistic AP VS Percent: 59 %
Brady Statistic AS VS Percent: 40 %
Brady Statistic RV Percent Paced: 0 %
Implantable Lead Implant Date: 20161123
Implantable Lead Implant Date: 20161123
Implantable Lead Location: 753860
Implantable Pulse Generator Implant Date: 20161123
Lead Channel Impedance Value: 355 Ohm
Lead Channel Impedance Value: 419 Ohm
Lead Channel Pacing Threshold Amplitude: 0.9 V
Lead Channel Pacing Threshold Pulse Width: 0.4 ms
Lead Channel Setting Pacing Amplitude: 2 V
MDC IDC LEAD LOCATION: 753859
MDC IDC MSMT BATTERY REMAINING PERCENTAGE: 80 %
MDC IDC MSMT LEADCHNL RA PACING THRESHOLD AMPLITUDE: 0.7 V
MDC IDC MSMT LEADCHNL RA PACING THRESHOLD PULSEWIDTH: 0.4 ms
MDC IDC PG SERIAL: 68664772
MDC IDC SESS DTM: 20190220054307
MDC IDC SET LEADCHNL RV PACING AMPLITUDE: 2 V
MDC IDC SET LEADCHNL RV PACING PULSEWIDTH: 0.4 ms
MDC IDC STAT BRADY AP VP PERCENT: 0 %
MDC IDC STAT BRADY AS VP PERCENT: 0 %
MDC IDC STAT BRADY RA PERCENT PACED: 59 %

## 2017-08-15 ENCOUNTER — Encounter: Payer: Self-pay | Admitting: Cardiology

## 2017-09-19 ENCOUNTER — Other Ambulatory Visit: Payer: Self-pay | Admitting: Cardiology

## 2017-09-27 ENCOUNTER — Ambulatory Visit
Admission: RE | Admit: 2017-09-27 | Discharge: 2017-09-27 | Disposition: A | Discharge status: Home / Self Care | Payer: Medicare HMO | Source: Ambulatory Visit | Attending: Family | Admitting: Family

## 2017-09-27 DIAGNOSIS — N6489 Other specified disorders of breast: Secondary | ICD-10-CM

## 2017-11-12 ENCOUNTER — Ambulatory Visit (INDEPENDENT_AMBULATORY_CARE_PROVIDER_SITE_OTHER): Payer: Medicare HMO | Admitting: *Deleted

## 2017-11-12 DIAGNOSIS — I442 Atrioventricular block, complete: Secondary | ICD-10-CM

## 2017-11-12 DIAGNOSIS — R55 Syncope and collapse: Secondary | ICD-10-CM | POA: Diagnosis not present

## 2017-11-12 NOTE — Progress Notes (Signed)
Remote pacemaker transmission.   

## 2017-11-14 LAB — CUP PACEART REMOTE DEVICE CHECK
Date Time Interrogation Session: 20190523102221
Implantable Lead Implant Date: 20161123
Implantable Lead Location: 753860
Implantable Lead Model: 5076
Implantable Pulse Generator Implant Date: 20161123
MDC IDC LEAD IMPLANT DT: 20161123
MDC IDC LEAD LOCATION: 753859
Pulse Gen Model: 394969
Pulse Gen Serial Number: 68664772

## 2018-02-03 ENCOUNTER — Telehealth: Payer: Self-pay | Admitting: Cardiology

## 2018-02-03 NOTE — Telephone Encounter (Signed)
New Message:      Pt is calling and states she would like to know what can be taken for pain medication besides Tylenol that would not conflict with the other medication she is currently taking.

## 2018-02-03 NOTE — Telephone Encounter (Signed)
Advised patient she may take Tylenol. She appreciates the return call.

## 2018-02-11 ENCOUNTER — Ambulatory Visit (INDEPENDENT_AMBULATORY_CARE_PROVIDER_SITE_OTHER): Payer: Medicare HMO | Admitting: *Deleted

## 2018-02-11 DIAGNOSIS — I442 Atrioventricular block, complete: Secondary | ICD-10-CM | POA: Diagnosis not present

## 2018-02-11 DIAGNOSIS — R55 Syncope and collapse: Secondary | ICD-10-CM

## 2018-02-11 NOTE — Progress Notes (Signed)
Remote pacemaker transmission.   

## 2018-03-21 LAB — CUP PACEART REMOTE DEVICE CHECK
Brady Statistic RA Percent Paced: 69 %
Brady Statistic RV Percent Paced: 0 %
Implantable Lead Implant Date: 20161123
Implantable Lead Location: 753859
Implantable Lead Location: 753860
Implantable Lead Model: 5076
Implantable Lead Model: 5076
Lead Channel Impedance Value: 353 Ohm
Lead Channel Pacing Threshold Amplitude: 0.9 V
Lead Channel Pacing Threshold Pulse Width: 0.4 ms
Lead Channel Setting Pacing Amplitude: 2 V
Lead Channel Setting Pacing Amplitude: 2 V
Lead Channel Setting Pacing Pulse Width: 0.4 ms
MDC IDC LEAD IMPLANT DT: 20161123
MDC IDC MSMT BATTERY REMAINING PERCENTAGE: 75 %
MDC IDC MSMT LEADCHNL RA PACING THRESHOLD AMPLITUDE: 0.7 V
MDC IDC MSMT LEADCHNL RA PACING THRESHOLD PULSEWIDTH: 0.4 ms
MDC IDC MSMT LEADCHNL RV IMPEDANCE VALUE: 414 Ohm
MDC IDC PG IMPLANT DT: 20161123
MDC IDC PG SERIAL: 68664772
MDC IDC SESS DTM: 20190927050849
MDC IDC STAT BRADY AP VP PERCENT: 0 %
MDC IDC STAT BRADY AP VS PERCENT: 69 %
MDC IDC STAT BRADY AS VP PERCENT: 0 %
MDC IDC STAT BRADY AS VS PERCENT: 31 %

## 2018-05-06 ENCOUNTER — Encounter: Payer: Self-pay | Admitting: Cardiology

## 2018-05-13 ENCOUNTER — Ambulatory Visit (INDEPENDENT_AMBULATORY_CARE_PROVIDER_SITE_OTHER): Payer: Medicare HMO

## 2018-05-13 DIAGNOSIS — R55 Syncope and collapse: Secondary | ICD-10-CM

## 2018-05-13 DIAGNOSIS — I442 Atrioventricular block, complete: Secondary | ICD-10-CM

## 2018-05-14 NOTE — Progress Notes (Signed)
Remote pacemaker transmission.   

## 2018-05-27 ENCOUNTER — Encounter: Payer: Self-pay | Admitting: Cardiology

## 2018-05-27 ENCOUNTER — Ambulatory Visit: Payer: Medicare HMO | Admitting: Cardiology

## 2018-05-27 VITALS — BP 130/86 | HR 66 | Ht 66.0 in | Wt 162.6 lb

## 2018-05-27 DIAGNOSIS — I471 Supraventricular tachycardia: Secondary | ICD-10-CM | POA: Diagnosis not present

## 2018-05-27 DIAGNOSIS — I442 Atrioventricular block, complete: Secondary | ICD-10-CM | POA: Diagnosis not present

## 2018-05-27 NOTE — Patient Instructions (Addendum)
Medication Instructions:  Your physician recommends that you continue on your current medications as directed. Please refer to the Current Medication list given to you today.  If you need a refill on your cardiac medications before your next appointment, please call your pharmacy.   Lab work: None ordered If you have labs (blood work) drawn today and your tests are completely normal, you will receive your results only by: Marland Kitchen MyChart Message (if you have MyChart) OR . A paper copy in the mail If you have any lab test that is abnormal or we need to change your treatment, we will call you to review the results.  Testing/Procedures: None ordered  Follow-Up: Remote monitoring is used to monitor your Pacemaker of ICD from home. This monitoring reduces the number of office visits required to check your device to one time per year. It allows [place] to keep an eye on the functioning of your device to ensure it is working properly. You are scheduled for a device check from home on 08/13/2018. You may send your transmission at any time that day. If you have a wireless device, the transmission will be sent automatically. After your physician reviews your transmission, you will receive a postcard with your next transmission date.  At [facility], you and your health needs are our priority.  As part of our continuing mission to provide you with exceptional heart care, we have created designated Provider Care Teams.  These Care Teams include your primary Cardiologist (physician) and Advanced Practice Providers (APPs -  Physician Assistants and Nurse Practitioners) who all work together to provide you with the care you need, when you need it. You will need a follow up appointment in 1 years.  Please call our office 2 months in advance to schedule this appointment.  You may see Dr. Elberta Fortis or one of the following Advanced Practice Providers on your designated Care Team:   Gypsy Balsam, NP . Francis Dowse,  PA-C  Thank you for choosing [facility]!!   Dory Horn, RN [phone]  Any Other Special Instructions Will Be Listed Below (If Applicable).
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
## 2018-05-29 LAB — CUP PACEART INCLINIC DEVICE CHECK
Date Time Interrogation Session: 20191203192600
Implantable Lead Implant Date: 20161123
Implantable Lead Location: 753859
Implantable Lead Model: 5076
Lead Channel Pacing Threshold Amplitude: 0.7 V
Lead Channel Pacing Threshold Amplitude: 0.7 V
Lead Channel Pacing Threshold Amplitude: 0.9 V
Lead Channel Pacing Threshold Amplitude: 1 V
Lead Channel Pacing Threshold Pulse Width: 0.4 ms
Lead Channel Pacing Threshold Pulse Width: 0.4 ms
Lead Channel Pacing Threshold Pulse Width: 0.4 ms
Lead Channel Pacing Threshold Pulse Width: 0.4 ms
Lead Channel Pacing Threshold Pulse Width: 0.4 ms
Lead Channel Sensing Intrinsic Amplitude: 1.8 mV
Lead Channel Setting Pacing Amplitude: 2 V
Lead Channel Setting Pacing Pulse Width: 0.4 ms
MDC IDC LEAD IMPLANT DT: 20161123
MDC IDC LEAD LOCATION: 753860
MDC IDC MSMT LEADCHNL RA IMPEDANCE VALUE: 351 Ohm
MDC IDC MSMT LEADCHNL RA PACING THRESHOLD AMPLITUDE: 0.7 V
MDC IDC MSMT LEADCHNL RA SENSING INTR AMPL: 2.1 mV
MDC IDC MSMT LEADCHNL RV IMPEDANCE VALUE: 429 Ohm
MDC IDC MSMT LEADCHNL RV PACING THRESHOLD AMPLITUDE: 1 V
MDC IDC MSMT LEADCHNL RV PACING THRESHOLD PULSEWIDTH: 0.4 ms
MDC IDC MSMT LEADCHNL RV SENSING INTR AMPL: 12.5 mV
MDC IDC MSMT LEADCHNL RV SENSING INTR AMPL: 12.9 mV
MDC IDC PG IMPLANT DT: 20161123
MDC IDC SET LEADCHNL RV PACING AMPLITUDE: 2 V
Pulse Gen Model: 394969
Pulse Gen Serial Number: 68664772

## 2018-07-09 LAB — CUP PACEART REMOTE DEVICE CHECK
Date Time Interrogation Session: 20200115080441
Implantable Lead Implant Date: 20161123
Implantable Lead Location: 753859
Implantable Lead Model: 5076
Implantable Pulse Generator Implant Date: 20161123
MDC IDC LEAD IMPLANT DT: 20161123
MDC IDC LEAD LOCATION: 753860
MDC IDC PG SERIAL: 68664772

## 2018-08-11 ENCOUNTER — Telehealth: Payer: Self-pay | Admitting: Cardiology

## 2018-08-11 NOTE — Telephone Encounter (Signed)
Patient called and stated that her device site feels sore. Patient has a Biotronik PPM implanted 05/18/2015. I asked her if the site is red, swollen, or hot to the touch. Pt stats that it is not. Patient stated that she has had the pain for 1-2 weeks. Patient states that the pain doesn't get worse w/ pain. She states that she can just be sitting and she will feel it getting sore. I informed pt that I would send message to Device Tech RN and someone would call her back after lunch time. Pt verbalized understanding.

## 2018-08-11 NOTE — Telephone Encounter (Signed)
Spoke with pt who re-iterated that site is not swollen, bruised or red. Advised pt to try tylenol for the pain as it could be muscle strain and to call back if the pain doesn't subside pt voiced understanding

## 2018-08-13 ENCOUNTER — Ambulatory Visit (INDEPENDENT_AMBULATORY_CARE_PROVIDER_SITE_OTHER): Payer: Self-pay

## 2018-08-13 DIAGNOSIS — I442 Atrioventricular block, complete: Secondary | ICD-10-CM

## 2018-08-13 DIAGNOSIS — R55 Syncope and collapse: Secondary | ICD-10-CM

## 2018-08-15 LAB — CUP PACEART REMOTE DEVICE CHECK
Date Time Interrogation Session: 20200221071029
Implantable Lead Implant Date: 20161123
Implantable Lead Location: 753859
Implantable Lead Location: 753860
MDC IDC LEAD IMPLANT DT: 20161123
MDC IDC PG IMPLANT DT: 20161123
MDC IDC PG SERIAL: 68664772
Pulse Gen Model: 394969

## 2018-08-21 NOTE — Progress Notes (Signed)
Remote pacemaker transmission.   

## 2018-08-22 ENCOUNTER — Encounter: Payer: Self-pay | Admitting: Cardiology

## 2018-11-12 ENCOUNTER — Ambulatory Visit (INDEPENDENT_AMBULATORY_CARE_PROVIDER_SITE_OTHER): Payer: Medicare Other | Admitting: *Deleted

## 2018-11-12 DIAGNOSIS — I442 Atrioventricular block, complete: Secondary | ICD-10-CM | POA: Diagnosis not present

## 2018-11-13 LAB — CUP PACEART REMOTE DEVICE CHECK
Battery Remaining Percentage: 75 %
Brady Statistic RA Percent Paced: 59 %
Brady Statistic RV Percent Paced: 0 %
Date Time Interrogation Session: 20200521092937
Implantable Lead Implant Date: 20161123
Implantable Lead Implant Date: 20161123
Implantable Lead Location: 753859
Implantable Lead Location: 753860
Implantable Lead Model: 5076
Implantable Lead Model: 5076
Implantable Pulse Generator Implant Date: 20161123
Lead Channel Impedance Value: 332 Ohm
Lead Channel Impedance Value: 410 Ohm
Lead Channel Pacing Threshold Amplitude: 0.7 V
Lead Channel Pacing Threshold Amplitude: 0.9 V
Lead Channel Pacing Threshold Pulse Width: 0.4 ms
Lead Channel Pacing Threshold Pulse Width: 0.4 ms
Lead Channel Sensing Intrinsic Amplitude: 11.8 mV
Lead Channel Sensing Intrinsic Amplitude: 2.4 mV
Lead Channel Setting Pacing Amplitude: 2 V
Lead Channel Setting Pacing Amplitude: 2 V
Lead Channel Setting Pacing Pulse Width: 0.4 ms
Pulse Gen Model: 394969
Pulse Gen Serial Number: 68664772

## 2018-11-21 ENCOUNTER — Encounter: Payer: Self-pay | Admitting: Cardiology

## 2018-11-21 NOTE — Progress Notes (Signed)
Remote pacemaker transmission.   

## 2019-02-11 ENCOUNTER — Ambulatory Visit (INDEPENDENT_AMBULATORY_CARE_PROVIDER_SITE_OTHER): Payer: Medicare Other | Admitting: *Deleted

## 2019-02-11 DIAGNOSIS — I442 Atrioventricular block, complete: Secondary | ICD-10-CM | POA: Diagnosis not present

## 2019-02-11 LAB — CUP PACEART REMOTE DEVICE CHECK
Date Time Interrogation Session: 20200819171111
Implantable Lead Implant Date: 20161123
Implantable Lead Implant Date: 20161123
Implantable Lead Location: 753859
Implantable Lead Location: 753860
Implantable Lead Model: 5076
Implantable Lead Model: 5076
Implantable Pulse Generator Implant Date: 20161123
Pulse Gen Model: 394969
Pulse Gen Serial Number: 68664772

## 2019-02-19 ENCOUNTER — Encounter: Payer: Self-pay | Admitting: Cardiology

## 2019-02-19 NOTE — Progress Notes (Signed)
Remote pacemaker transmission.   

## 2019-04-13 ENCOUNTER — Telehealth: Payer: Self-pay | Admitting: Emergency Medicine

## 2019-04-13 NOTE — Telephone Encounter (Signed)
Pt returning Cindy,Rn phone call. 

## 2019-04-13 NOTE — Telephone Encounter (Signed)
LMOM. Need to assess for sx related to increase in HVR per trend in Biotonik alert. Need to confirm no missed doses of metoprolol 50 mg twice a day.

## 2019-04-13 NOTE — Telephone Encounter (Signed)
Alert received from [facility] for increased trend of HVR over last 24 hrs. No recordings and no EGMs. Patient reports some increased tiredness over the past week. No CP, SOB, syncope or palpitations. C/O occasional soreness at device site. No redness, edema or discharge. Confirmed she is taking metoprolol 50 mg twice a day. Will call clinic if she develops any of the abocve symptoms.

## 2019-05-13 ENCOUNTER — Ambulatory Visit (INDEPENDENT_AMBULATORY_CARE_PROVIDER_SITE_OTHER): Payer: Medicare Other | Admitting: *Deleted

## 2019-05-13 DIAGNOSIS — I495 Sick sinus syndrome: Secondary | ICD-10-CM

## 2019-05-13 DIAGNOSIS — I442 Atrioventricular block, complete: Secondary | ICD-10-CM

## 2019-05-14 LAB — CUP PACEART REMOTE DEVICE CHECK
Date Time Interrogation Session: 20201118122353
Implantable Lead Implant Date: 20161123
Implantable Lead Implant Date: 20161123
Implantable Lead Location: 753859
Implantable Lead Location: 753860
Implantable Lead Model: 5076
Implantable Lead Model: 5076
Implantable Pulse Generator Implant Date: 20161123
Pulse Gen Model: 394969
Pulse Gen Serial Number: 68664772

## 2019-06-11 NOTE — Progress Notes (Signed)
Remote pacemaker transmission.   

## 2019-07-19 ENCOUNTER — Ambulatory Visit: Payer: Medicare Other | Attending: Internal Medicine

## 2019-07-19 DIAGNOSIS — Z23 Encounter for immunization: Secondary | ICD-10-CM | POA: Insufficient documentation

## 2019-07-19 NOTE — Progress Notes (Signed)
   Covid-19 Vaccination Clinic  Name:  Autumn Benjamin    MRN: [id number] DOB: Mar 07, 1941  07/19/2019  [name] was observed post Covid-19 immunization for 15 minutes without incidence. She was provided with Vaccine Information Sheet and instruction to access the V-Safe system.   [name] was instructed to call 911 with any severe reactions post vaccine: Marland Kitchen Difficulty breathing  . Swelling of your face and throat  . A fast heartbeat  . A bad rash all over your body  . Dizziness and weakness    Immunizations Administered    Name Date Dose VIS Date Route   Pfizer COVID-19 Vaccine 07/19/2019 12:10 PM 0.3 mL 06/05/2019 Intramuscular   Manufacturer: [employer], [employer]   Lot: [id number]   NDC: [address]-8

## 2019-07-20 ENCOUNTER — Other Ambulatory Visit: Payer: Self-pay | Admitting: Nurse Practitioner

## 2019-07-20 DIAGNOSIS — M810 Age-related osteoporosis without current pathological fracture: Secondary | ICD-10-CM

## 2019-07-20 DIAGNOSIS — Z1231 Encounter for screening mammogram for malignant neoplasm of breast: Secondary | ICD-10-CM

## 2019-08-10 ENCOUNTER — Ambulatory Visit: Payer: Medicare Other | Attending: Internal Medicine

## 2019-08-10 DIAGNOSIS — Z23 Encounter for immunization: Secondary | ICD-10-CM | POA: Insufficient documentation

## 2019-08-10 NOTE — Progress Notes (Signed)
   Covid-19 Vaccination Clinic  Name:  Autumn Benjamin    MRN: [id number] DOB: 14-Nov-1940  08/10/2019  [name] was observed post Covid-19 immunization for 15 minutes without incidence. She was provided with Vaccine Information Sheet and instruction to access the V-Safe system.   [name] was instructed to call 911 with any severe reactions post vaccine: Marland Kitchen Difficulty breathing  . Swelling of your face and throat  . A fast heartbeat  . A bad rash all over your body  . Dizziness and weakness    Immunizations Administered    Name Date Dose VIS Date Route   Pfizer COVID-19 Vaccine 08/10/2019  8:37 AM 0.3 mL 06/05/2019 Intramuscular   Manufacturer: [employer], [employer]   Lot: [id number]   NDC: [address]-4

## 2019-08-12 ENCOUNTER — Ambulatory Visit (INDEPENDENT_AMBULATORY_CARE_PROVIDER_SITE_OTHER): Payer: Medicare Other | Admitting: *Deleted

## 2019-08-12 DIAGNOSIS — I495 Sick sinus syndrome: Secondary | ICD-10-CM

## 2019-08-12 LAB — CUP PACEART REMOTE DEVICE CHECK
Date Time Interrogation Session: 20210217164256
Implantable Lead Implant Date: 20161123
Implantable Lead Implant Date: 20161123
Implantable Lead Location: 753859
Implantable Lead Location: 753860
Implantable Lead Model: 5076
Implantable Lead Model: 5076
Implantable Pulse Generator Implant Date: 20161123
Pulse Gen Model: 394969
Pulse Gen Serial Number: 68664772

## 2019-08-13 NOTE — Progress Notes (Signed)
PPM Remote  

## 2019-09-30 ENCOUNTER — Other Ambulatory Visit: Payer: Self-pay

## 2019-09-30 ENCOUNTER — Ambulatory Visit
Admission: RE | Admit: 2019-09-30 | Discharge: 2019-09-30 | Disposition: A | Discharge status: Home / Self Care | Payer: Medicare Other | Source: Ambulatory Visit | Attending: Nurse Practitioner | Admitting: Nurse Practitioner

## 2019-09-30 DIAGNOSIS — M810 Age-related osteoporosis without current pathological fracture: Secondary | ICD-10-CM

## 2019-09-30 DIAGNOSIS — Z1231 Encounter for screening mammogram for malignant neoplasm of breast: Secondary | ICD-10-CM

## 2019-11-11 ENCOUNTER — Ambulatory Visit (INDEPENDENT_AMBULATORY_CARE_PROVIDER_SITE_OTHER): Payer: Medicare Other | Admitting: *Deleted

## 2019-11-11 DIAGNOSIS — I495 Sick sinus syndrome: Secondary | ICD-10-CM | POA: Diagnosis not present

## 2019-11-11 DIAGNOSIS — I442 Atrioventricular block, complete: Secondary | ICD-10-CM

## 2019-11-11 LAB — CUP PACEART REMOTE DEVICE CHECK
Date Time Interrogation Session: 20210519093351
Implantable Lead Implant Date: 20161123
Implantable Lead Implant Date: 20161123
Implantable Lead Location: 753859
Implantable Lead Location: 753860
Implantable Lead Model: 5076
Implantable Lead Model: 5076
Implantable Pulse Generator Implant Date: 20161123
Pulse Gen Model: 394969
Pulse Gen Serial Number: 68664772

## 2019-11-13 NOTE — Progress Notes (Signed)
Remote pacemaker transmission.   

## 2020-01-08 ENCOUNTER — Telehealth: Payer: Self-pay

## 2020-01-08 NOTE — Telephone Encounter (Signed)
Patient reports she has been having intermittent aching pain at PM implant site for "awhile now" when trying to assess how long, asked patient if it has been weeks or months, she stated longer.  Pt denies any change in appearance of site.  Pt has not taken any medication or made any changes to alleviate pain.  Pt scheduled for in-office visit with Dr. Elberta Fortis on 8/24, she wanted to be seen sooner.  Advised we could schedule her to be seen by PA, she declined stating she only wanted to see Dr. Elberta Fortis.  Advised pt to continue to monitor, notify MD if any changes in skin appearance or swelling.  May use tylenol for pain, notify if no improvement.

## 2020-01-08 NOTE — Telephone Encounter (Signed)
The pt states her pacemaker site hurts. She states it is not red. She states it is warm to touch.

## 2020-02-10 ENCOUNTER — Ambulatory Visit (INDEPENDENT_AMBULATORY_CARE_PROVIDER_SITE_OTHER): Payer: Medicare Other | Admitting: *Deleted

## 2020-02-10 DIAGNOSIS — R55 Syncope and collapse: Secondary | ICD-10-CM

## 2020-02-11 LAB — CUP PACEART REMOTE DEVICE CHECK
Battery Remaining Percentage: 65 %
Brady Statistic RA Percent Paced: 60 %
Brady Statistic RV Percent Paced: 1 %
Date Time Interrogation Session: 20210818081701
Implantable Lead Implant Date: 20161123
Implantable Lead Implant Date: 20161123
Implantable Lead Location: 753859
Implantable Lead Location: 753860
Implantable Lead Model: 5076
Implantable Lead Model: 5076
Implantable Pulse Generator Implant Date: 20161123
Lead Channel Impedance Value: 332 Ohm
Lead Channel Impedance Value: 429 Ohm
Lead Channel Pacing Threshold Amplitude: 0.7 V
Lead Channel Pacing Threshold Amplitude: 0.8 V
Lead Channel Pacing Threshold Pulse Width: 0.4 ms
Lead Channel Pacing Threshold Pulse Width: 0.4 ms
Lead Channel Sensing Intrinsic Amplitude: 1.1 mV
Lead Channel Sensing Intrinsic Amplitude: 10.8 mV
Lead Channel Setting Pacing Amplitude: 2 V
Lead Channel Setting Pacing Amplitude: 2 V
Lead Channel Setting Pacing Pulse Width: 0.4 ms
Pulse Gen Model: 394969
Pulse Gen Serial Number: 68664772

## 2020-02-12 NOTE — Progress Notes (Signed)
Remote pacemaker transmission.   

## 2020-02-16 ENCOUNTER — Encounter: Payer: Self-pay | Admitting: Cardiology

## 2020-02-16 ENCOUNTER — Ambulatory Visit (INDEPENDENT_AMBULATORY_CARE_PROVIDER_SITE_OTHER): Payer: Medicare Other | Admitting: Cardiology

## 2020-02-16 ENCOUNTER — Other Ambulatory Visit: Payer: Self-pay

## 2020-02-16 VITALS — BP 136/88 | HR 81 | Ht 66.0 in | Wt 149.0 lb

## 2020-02-16 DIAGNOSIS — I442 Atrioventricular block, complete: Secondary | ICD-10-CM | POA: Diagnosis not present
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
## 2020-05-11 ENCOUNTER — Ambulatory Visit (INDEPENDENT_AMBULATORY_CARE_PROVIDER_SITE_OTHER): Payer: Medicare Other

## 2020-05-11 DIAGNOSIS — R55 Syncope and collapse: Secondary | ICD-10-CM | POA: Diagnosis not present

## 2020-05-11 LAB — CUP PACEART REMOTE DEVICE CHECK
Date Time Interrogation Session: 20211117063606
Implantable Lead Implant Date: 20161123
Implantable Lead Implant Date: 20161123
Implantable Lead Location: 753859
Implantable Lead Location: 753860
Implantable Lead Model: 5076
Implantable Lead Model: 5076
Implantable Pulse Generator Implant Date: 20161123
Pulse Gen Model: 394969
Pulse Gen Serial Number: 68664772

## 2020-05-13 NOTE — Progress Notes (Signed)
Remote pacemaker transmission.   

## 2020-08-10 ENCOUNTER — Ambulatory Visit (INDEPENDENT_AMBULATORY_CARE_PROVIDER_SITE_OTHER): Payer: Medicare Other

## 2020-08-10 DIAGNOSIS — I495 Sick sinus syndrome: Secondary | ICD-10-CM

## 2020-08-10 LAB — CUP PACEART REMOTE DEVICE CHECK
Date Time Interrogation Session: 20220215121916
Implantable Lead Implant Date: 20161123
Implantable Lead Implant Date: 20161123
Implantable Lead Location: 753859
Implantable Lead Location: 753860
Implantable Lead Model: 5076
Implantable Lead Model: 5076
Implantable Pulse Generator Implant Date: 20161123
Pulse Gen Model: 394969
Pulse Gen Serial Number: 68664772

## 2020-08-17 NOTE — Progress Notes (Signed)
Remote pacemaker transmission.   

## 2020-11-09 ENCOUNTER — Ambulatory Visit (INDEPENDENT_AMBULATORY_CARE_PROVIDER_SITE_OTHER): Payer: Medicare Other

## 2020-11-09 DIAGNOSIS — I442 Atrioventricular block, complete: Secondary | ICD-10-CM

## 2020-11-10 LAB — CUP PACEART REMOTE DEVICE CHECK
Battery Remaining Percentage: 60 %
Brady Statistic RA Percent Paced: 60 %
Brady Statistic RV Percent Paced: 1 %
Date Time Interrogation Session: 20220518065438
Implantable Lead Implant Date: 20161123
Implantable Lead Implant Date: 20161123
Implantable Lead Location: 753859
Implantable Lead Location: 753860
Implantable Lead Model: 5076
Implantable Lead Model: 5076
Implantable Pulse Generator Implant Date: 20161123
Lead Channel Impedance Value: 351 Ohm
Lead Channel Impedance Value: 449 Ohm
Lead Channel Pacing Threshold Amplitude: 0.6 V
Lead Channel Pacing Threshold Amplitude: 0.9 V
Lead Channel Pacing Threshold Pulse Width: 0.4 ms
Lead Channel Pacing Threshold Pulse Width: 0.4 ms
Lead Channel Sensing Intrinsic Amplitude: 1.1 mV
Lead Channel Sensing Intrinsic Amplitude: 10.8 mV
Lead Channel Setting Pacing Amplitude: 2 V
Lead Channel Setting Pacing Amplitude: 2 V
Lead Channel Setting Pacing Pulse Width: 0.4 ms
Pulse Gen Model: 394969
Pulse Gen Serial Number: 68664772

## 2020-11-15 ENCOUNTER — Other Ambulatory Visit: Payer: Self-pay | Admitting: Nurse Practitioner

## 2020-11-15 DIAGNOSIS — Z1231 Encounter for screening mammogram for malignant neoplasm of breast: Secondary | ICD-10-CM

## 2020-12-02 NOTE — Progress Notes (Signed)
Remote pacemaker transmission.   

## 2021-01-12 ENCOUNTER — Ambulatory Visit
Admission: RE | Admit: 2021-01-12 | Discharge: 2021-01-12 | Disposition: A | Discharge status: Home / Self Care | Payer: Medicare Other | Source: Ambulatory Visit | Attending: Nurse Practitioner | Admitting: Nurse Practitioner

## 2021-01-12 ENCOUNTER — Other Ambulatory Visit: Payer: Self-pay

## 2021-01-12 DIAGNOSIS — Z1231 Encounter for screening mammogram for malignant neoplasm of breast: Secondary | ICD-10-CM

## 2021-01-16 ENCOUNTER — Other Ambulatory Visit: Payer: Self-pay | Admitting: Nurse Practitioner

## 2021-01-16 DIAGNOSIS — R928 Other abnormal and inconclusive findings on diagnostic imaging of breast: Secondary | ICD-10-CM

## 2021-02-02 ENCOUNTER — Ambulatory Visit
Admission: RE | Admit: 2021-02-02 | Discharge: 2021-02-02 | Disposition: A | Discharge status: Home / Self Care | Payer: Medicare Other | Source: Ambulatory Visit | Attending: Nurse Practitioner | Admitting: Nurse Practitioner

## 2021-02-02 ENCOUNTER — Other Ambulatory Visit: Payer: Self-pay | Admitting: Nurse Practitioner

## 2021-02-02 ENCOUNTER — Other Ambulatory Visit: Payer: Self-pay

## 2021-02-02 DIAGNOSIS — R928 Other abnormal and inconclusive findings on diagnostic imaging of breast: Secondary | ICD-10-CM

## 2021-02-08 ENCOUNTER — Ambulatory Visit (INDEPENDENT_AMBULATORY_CARE_PROVIDER_SITE_OTHER): Payer: Medicare Other

## 2021-02-08 DIAGNOSIS — I442 Atrioventricular block, complete: Secondary | ICD-10-CM

## 2021-02-08 LAB — CUP PACEART REMOTE DEVICE CHECK
Battery Remaining Percentage: 60 %
Brady Statistic RA Percent Paced: 61 %
Brady Statistic RV Percent Paced: 2 %
Date Time Interrogation Session: 20220817081258
Implantable Lead Implant Date: 20161123
Implantable Lead Implant Date: 20161123
Implantable Lead Location: 753859
Implantable Lead Location: 753860
Implantable Lead Model: 5076
Implantable Lead Model: 5076
Implantable Pulse Generator Implant Date: 20161123
Lead Channel Impedance Value: 351 Ohm
Lead Channel Impedance Value: 459 Ohm
Lead Channel Pacing Threshold Amplitude: 0.6 V
Lead Channel Pacing Threshold Amplitude: 0.9 V
Lead Channel Pacing Threshold Pulse Width: 0.4 ms
Lead Channel Pacing Threshold Pulse Width: 0.4 ms
Lead Channel Sensing Intrinsic Amplitude: 1.1 mV
Lead Channel Sensing Intrinsic Amplitude: 10.7 mV
Lead Channel Setting Pacing Amplitude: 2 V
Lead Channel Setting Pacing Amplitude: 2 V
Lead Channel Setting Pacing Pulse Width: 0.4 ms
Pulse Gen Model: 394969
Pulse Gen Serial Number: 68664772

## 2021-02-10 ENCOUNTER — Ambulatory Visit
Admission: RE | Admit: 2021-02-10 | Discharge: 2021-02-10 | Disposition: A | Discharge status: Home / Self Care | Payer: Medicare Other | Source: Ambulatory Visit | Attending: Nurse Practitioner | Admitting: Nurse Practitioner

## 2021-02-10 ENCOUNTER — Other Ambulatory Visit: Payer: Self-pay

## 2021-02-10 DIAGNOSIS — R928 Other abnormal and inconclusive findings on diagnostic imaging of breast: Secondary | ICD-10-CM

## 2021-02-16 ENCOUNTER — Other Ambulatory Visit: Payer: Self-pay

## 2021-02-16 ENCOUNTER — Encounter: Payer: Self-pay | Admitting: Cardiology

## 2021-02-16 ENCOUNTER — Ambulatory Visit (INDEPENDENT_AMBULATORY_CARE_PROVIDER_SITE_OTHER): Payer: Medicare Other | Admitting: Cardiology

## 2021-02-16 VITALS — BP 132/74 | HR 78 | Ht 69.0 in | Wt 138.6 lb

## 2021-02-16 DIAGNOSIS — I495 Sick sinus syndrome: Secondary | ICD-10-CM | POA: Diagnosis not present

## 2021-02-16 DIAGNOSIS — I442 Atrioventricular block, complete: Secondary | ICD-10-CM
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
## 2021-02-16 NOTE — Patient Instructions (Addendum)
Medication Instructions:  NO CHANGES *If you need a refill on your cardiac medications before your next appointment, please call your pharmacy*   Lab Work: NONE If you have labs (blood work) drawn today and your tests are completely normal, you will receive your results only by: MyChart Message (if you have MyChart) OR A paper copy in the mail If you have any lab test that is abnormal or we need to change your treatment, we will call you to review the results.   Testing/Procedures: NONE   Follow-Up: At [facility], you and your health needs are our priority.  As part of our continuing mission to provide you with exceptional heart care, we have created designated Provider Care Teams.  These Care Teams include your primary Cardiologist (physician) and Advanced Practice Providers (APPs -  Physician Assistants and Nurse Practitioners) who all work together to provide you with the care you need, when you need it.  We recommend signing up for the patient portal called "MyChart".  Sign up information is provided on this After Visit Summary.  MyChart is used to connect with patients for Virtual Visits (Telemedicine).  Patients are able to view lab/test results, encounter notes, upcoming appointments, etc.  Non-urgent messages can be sent to your provider as well.   To learn more about what you can do with MyChart, go to [email or web address].    Your next appointment:   1 year(s)  The format for your next appointment:   In Person  Provider:   DR CAMNITZ  Other Instructions

## 2021-02-28 NOTE — Progress Notes (Signed)
Remote pacemaker transmission.   

## 2021-04-24 ENCOUNTER — Telehealth: Payer: Self-pay

## 2021-04-24 NOTE — Telephone Encounter (Signed)
Biotronik alert for increased HVR episodes, no details provided.  Attempted tor each pt to assess symptoms and med compliance.  No answer, LVM with device clinic # and hours.    Medications include Metoprolol 25 mg BID.    Also left VM for pt son, Luisa Hart (DPR on file).

## 2021-04-28 ENCOUNTER — Telehealth: Payer: Self-pay

## 2021-04-28 NOTE — Telephone Encounter (Signed)
Second unsuccessful telephone encounter to patient to follow up on recent HVR episodes and medication compliance. [employer] compliant VM message left requesting call back to [phone].

## 2021-04-28 NOTE — Telephone Encounter (Signed)
Incoming call from patient. Verbally reviewed 04/24/21 transmission with HVR episodes. Patient states she was asymptomatic. Continues to take Metoprolol as prescribed. No new episodes noted on 04/26/21 transmission. Patient appreciative of call and follow up. Will continue to monitor remote transmissions.

## 2021-04-28 NOTE — Telephone Encounter (Signed)
The patient is returning nurse call. 

## 2021-05-10 ENCOUNTER — Ambulatory Visit (INDEPENDENT_AMBULATORY_CARE_PROVIDER_SITE_OTHER): Payer: Medicare Other

## 2021-05-10 DIAGNOSIS — I442 Atrioventricular block, complete: Secondary | ICD-10-CM | POA: Diagnosis not present

## 2021-05-10 LAB — CUP PACEART REMOTE DEVICE CHECK
Date Time Interrogation Session: 20221116080040
Implantable Lead Implant Date: 20161123
Implantable Lead Implant Date: 20161123
Implantable Lead Location: 753859
Implantable Lead Location: 753860
Implantable Lead Model: 5076
Implantable Lead Model: 5076
Implantable Pulse Generator Implant Date: 20161123
Pulse Gen Model: 394969
Pulse Gen Serial Number: 68664772

## 2021-05-17 NOTE — Progress Notes (Signed)
Remote pacemaker transmission.   

## 2021-05-26 ENCOUNTER — Telehealth: Payer: Self-pay | Admitting: Cardiology

## 2021-05-26 NOTE — Telephone Encounter (Signed)
Patient calling in that bout pain that runs from the back of her neck to the top of her heads. She states when the pain comes its difficult to sleep at night. Have patient an appt for next Friday will tillery. Please advise

## 2021-05-26 NOTE — Telephone Encounter (Signed)
Patient stated she is having terrible headaches. The pain starts at the back of her head at the base and goes through to her forehead. Patient denies elevated BP, dizziness, vision changes, unsteady gait.  Advised her that this did not seem cardiac related and I didn't think the appointment that was made here is going to benefit her.  Advised to contact her PCP for advisement. Verbalized understanding and agreement to remove appointment made here.

## 2021-06-02 ENCOUNTER — Ambulatory Visit: Payer: Medicare Other | Admitting: Student

## 2021-06-09 ENCOUNTER — Telehealth: Payer: Self-pay | Admitting: Cardiology

## 2021-06-09 NOTE — Telephone Encounter (Signed)
Pt aware she can take what ever pain medication her and PCP think she needs. Aware no issues with medications and PPM device. Patient verbalized understanding and agreeable to plan.

## 2021-06-09 NOTE — Telephone Encounter (Signed)
Patient states she has a really bad headache on the right side of her head that goes from the front to the back.  She had gone to her PCP her BP was 178/64.  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

## 2021-06-09 NOTE — Telephone Encounter (Signed)
Left message to call back  

## 2021-06-09 NOTE — Telephone Encounter (Signed)
Pt was returning nurse call. I told her the nurse will give her a call back. 

## 2021-08-09 ENCOUNTER — Ambulatory Visit (INDEPENDENT_AMBULATORY_CARE_PROVIDER_SITE_OTHER): Payer: Medicare Other

## 2021-08-09 DIAGNOSIS — I442 Atrioventricular block, complete: Secondary | ICD-10-CM

## 2021-08-09 LAB — CUP PACEART REMOTE DEVICE CHECK
Date Time Interrogation Session: 20230215084735
Implantable Lead Implant Date: 20161123
Implantable Lead Implant Date: 20161123
Implantable Lead Location: 753859
Implantable Lead Location: 753860
Implantable Lead Model: 5076
Implantable Lead Model: 5076
Implantable Pulse Generator Implant Date: 20161123
Pulse Gen Model: 394969
Pulse Gen Serial Number: 68664772

## 2021-08-15 NOTE — Progress Notes (Signed)
Remote pacemaker transmission.   

## 2021-10-04 ENCOUNTER — Encounter: Payer: Self-pay | Admitting: Orthopaedic Surgery

## 2021-10-04 ENCOUNTER — Ambulatory Visit: Payer: Medicare Other | Admitting: Orthopaedic Surgery

## 2021-10-04 DIAGNOSIS — M1712 Unilateral primary osteoarthritis, left knee: Secondary | ICD-10-CM | POA: Diagnosis not present
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
## 2021-10-05 ENCOUNTER — Telehealth: Payer: Self-pay | Admitting: *Deleted

## 2021-10-05 NOTE — Telephone Encounter (Signed)
? ?  Pre-operative Risk Assessment  ?  ?Patient Name: Autumn Benjamin  ?DOB: 1940/12/09 ?MRN: [id number]  ? ?  ? ?Request for Surgical Clearance   ? ?Procedure:   Left total knee arthroplasty ? ?Date of Surgery:  Clearance TBD                              ?   ?Surgeon:  DR. MARK YATES ?Surgeon's Group or Practice Name:  [facility] ?Phone number:  [phone] ?Fax number:  [phone] ?  ?Type of Clearance Requested:   ?- Medical  ?  ?Type of Anesthesia:  Spinal ?  ?Additional requests/questions:     ? ?Signed, ?Bell Cai Avanell Shackleton   ?10/05/2021, 1:59 PM  ? ?

## 2021-10-05 NOTE — Telephone Encounter (Signed)
Primary Cardiologist:Will Jorja Loa, MD ? ?Chart reviewed as part of pre-operative protocol coverage. Because of Autumn Benjamin past medical history and time since last visit, he/she will require a virtual visit/telephone call in order to better assess preoperative cardiovascular risk. ? ?Pre-op covering staff: ?- Please contact patient, obtain consent, and schedule appointment  ? ?If applicable, this message will also be routed to pharmacy pool and/or primary cardiologist for input on holding anticoagulant/antiplatelet agent as requested below so that this information is available at time of patient's appointment.  ? ? ?Levi Aland, NP-C ? ?  ?10/05/2021, 5:19 PM ?[facility] ?[address], [address] ?Office [phone] Fax [phone] ?' ?

## 2021-10-06 ENCOUNTER — Telehealth: Payer: Self-pay | Admitting: *Deleted

## 2021-10-06 NOTE — Telephone Encounter (Signed)
Pt agreeable to plan of care for tele pre op appt 10/10/21 @ 9:20 per pt request. Med rec and consent are done. ? ?  ?Patient Consent for Virtual Visit  ? ? ?   ? ?[name] has provided verbal consent on 10/06/2021 for a virtual visit (video or telephone). ? ? ?CONSENT FOR VIRTUAL VISIT FOR:  Autumn Benjamin  ?By participating in this virtual visit I agree to the following: ? ?I hereby voluntarily request, consent and authorize [facility] and its employed or contracted physicians, physician assistants, nurse practitioners or other licensed health care professionals (the Practitioner), to provide me with telemedicine health care services (the ?Services") as deemed necessary by the treating Practitioner. I acknowledge and consent to receive the Services by the Practitioner via telemedicine. I understand that the telemedicine visit will involve communicating with the Practitioner through live audiovisual communication technology and the disclosure of certain medical information by electronic transmission. I acknowledge that I have been given the opportunity to request an in-person assessment or other available alternative prior to the telemedicine visit and am voluntarily participating in the telemedicine visit. ? ?I understand that I have the right to withhold or withdraw my consent to the use of telemedicine in the course of my care at any time, without affecting my right to future care or treatment, and that the Practitioner or I may terminate the telemedicine visit at any time. I understand that I have the right to inspect all information obtained and/or recorded in the course of the telemedicine visit and may receive copies of available information for a reasonable fee.  I understand that some of the potential risks of receiving the Services via telemedicine include:  ?Delay or interruption in medical evaluation due to technological equipment failure or disruption; ?Information transmitted may not be  sufficient (e.g. poor resolution of images) to allow for appropriate medical decision making by the Practitioner; and/or  ?In rare instances, security protocols could fail, causing a breach of personal health information. ? ?Furthermore, I acknowledge that it is my responsibility to provide information about my medical history, conditions and care that is complete and accurate to the best of my ability. I acknowledge that Practitioner's advice, recommendations, and/or decision may be based on factors not within their control, such as incomplete or inaccurate data provided by me or distortions of diagnostic images or specimens that may result from electronic transmissions. I understand that the practice of medicine is not an exact science and that Practitioner makes no warranties or guarantees regarding treatment outcomes. I acknowledge that a copy of this consent can be made available to me via my patient portal ([place]), or I can request a printed copy by calling the office of [facility].   ? ?I understand that my insurance will be billed for this visit.  ? ?I have read or had this consent read to me. ?I understand the contents of this consent, which adequately explains the benefits and risks of the Services being provided via telemedicine.  ?I have been provided ample opportunity to ask questions regarding this consent and the Services and have had my questions answered to my satisfaction. ?I give my informed consent for the services to be provided through the use of telemedicine in my medical care ? ? ? ?

## 2021-10-06 NOTE — Telephone Encounter (Signed)
Pt agreeable to plan of care for tele pre op appt 10/10/21 @ 9:20 per pt request. Med rec and consent are done. ?

## 2021-10-10 ENCOUNTER — Ambulatory Visit (INDEPENDENT_AMBULATORY_CARE_PROVIDER_SITE_OTHER): Payer: Medicare Other | Admitting: Physician Assistant

## 2021-10-10 ENCOUNTER — Encounter: Payer: Self-pay | Admitting: Physician Assistant

## 2021-10-10 DIAGNOSIS — Z01810 Encounter for preprocedural cardiovascular examination: Secondary | ICD-10-CM
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
## 2021-10-11 NOTE — Progress Notes (Signed)
PT HAS APPT 10/27/21 WITH RENEE URSUY, PAC ?

## 2021-10-11 NOTE — Progress Notes (Signed)
Message has been sent to Harrietta Guardian, EP scheduler to reach out to the pt with an appt for pre op clearance per Ronie Spies, [facility] for EP appt.  ?

## 2021-10-11 NOTE — Progress Notes (Signed)
[facility] for [facility]t  ?

## 2021-10-25 ENCOUNTER — Ambulatory Visit: Payer: Medicare Other | Admitting: Physician Assistant
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
## 2021-10-27 ENCOUNTER — Encounter: Payer: Self-pay | Admitting: Physician Assistant

## 2021-10-27 ENCOUNTER — Encounter: Payer: Self-pay | Admitting: *Deleted

## 2021-10-27 ENCOUNTER — Ambulatory Visit: Payer: Medicare Other | Admitting: Physician Assistant

## 2021-10-27 VITALS — BP 142/96 | HR 72 | Ht 63.5 in | Wt 142.6 lb

## 2021-10-27 DIAGNOSIS — Z01818 Encounter for other preprocedural examination: Secondary | ICD-10-CM | POA: Diagnosis not present

## 2021-10-27 DIAGNOSIS — Z950 Presence of cardiac pacemaker: Secondary | ICD-10-CM | POA: Diagnosis not present

## 2021-10-27 DIAGNOSIS — I442 Atrioventricular block, complete: Secondary | ICD-10-CM | POA: Diagnosis not present

## 2021-10-27 DIAGNOSIS — I471 Supraventricular tachycardia: Secondary | ICD-10-CM | POA: Diagnosis not present

## 2021-10-27 DIAGNOSIS — R0600 Dyspnea, unspecified: Secondary | ICD-10-CM

## 2021-10-27 DIAGNOSIS — I10 Essential (primary) hypertension: Secondary | ICD-10-CM

## 2021-10-27 LAB — BASIC METABOLIC PANEL
BUN/Creatinine Ratio: 24 (ref 12–28)
BUN: 19 mg/dL (ref 8–27)
CO2: 24 mmol/L (ref 20–29)
Calcium: 9.6 mg/dL (ref 8.7–10.3)
Chloride: 103 mmol/L (ref 96–106)
Creatinine, Ser: 0.79 mg/dL (ref 0.57–1.00)
Glucose: 90 mg/dL (ref 70–99)
Potassium: 4.3 mmol/L (ref 3.5–5.2)
Sodium: 140 mmol/L (ref 134–144)
eGFR: 76 mL/min/{1.73_m2} (ref >59)

## 2021-10-27 LAB — MAGNESIUM: Magnesium: 2.2 mg/dL (ref 1.6–2.3)

## 2021-10-27 MED ORDER — METOPROLOL TARTRATE 50 MG PO TABS: 50.0000 mg | ORAL_TABLET | Freq: Two times a day (BID) | ORAL | 3 refills | Status: DC

## 2021-10-27 NOTE — Patient Instructions (Addendum)
Medication Instructions:  ? ? ?START TAKING: METOPROLOL 50 MG TWICE A DAY  ? ?*If you need a refill on your cardiac medications before your next appointment, please call your pharmacy* ? ? ?Lab Work:  BMET AND MAG TODAY  ? ?If you have labs (blood work) drawn today and your tests are completely normal, you will receive your results only by: ?MyChart Message (if you have MyChart) OR ?A paper copy in the mail ?If you have any lab test that is abnormal or we need to change your treatment, we will call you to review the results. ? ? ?Testing/Procedures: Your physician has requested that you have a lexiscan myoview. For further information please visit [email or web address] Please follow instruction sheet, as given. ? ? ?Follow-Up: ?At [facility], you and your health needs are our priority.  As part of our continuing mission to provide you with exceptional heart care, we have created designated Provider Care Teams.  These Care Teams include your primary Cardiologist (physician) and Advanced Practice Providers (APPs -  Physician Assistants and Nurse Practitioners) who all work together to provide you with the care you need, when you need it. ? ?We recommend signing up for the patient portal called "MyChart".  Sign up information is provided on this After Visit Summary.  MyChart is used to connect with patients for Virtual Visits (Telemedicine).  Patients are able to view lab/test results, encounter notes, upcoming appointments, etc.  Non-urgent messages can be sent to your provider as well.   ?To learn more about what you can do with MyChart, go to [email or web address].   ? ?Your next appointment:   ?6 month(s) ? ?The format for your next appointment:   ?In Person ? ?Provider:   ?You may see Dr. Elberta Fortis  one of the following Advanced Practice Providers on your designated Care Team:   ?Francis Dowse, PA-C ?Casimiro Needle "Mardelle Matte" [place], PA-C  ? ?Other Instructions ? ? ?Important Information About Sugar ? ? ? ? ?  ?

## 2021-10-30 ENCOUNTER — Telehealth (HOSPITAL_COMMUNITY): Payer: Self-pay | Admitting: *Deleted

## 2021-10-30 NOTE — Telephone Encounter (Signed)
Patient given detailed instructions per Myocardial Perfusion Study Information Sheet for the test on 11/01/2021 at 7:15. Patient notified to arrive 15 minutes early and that it is imperative to arrive on time for appointment to keep from having the test rescheduled. ? If you need to cancel or reschedule your appointment, please call the office within 24 hours of your appointment. . Patient verbalized understanding.Autumn Benjamin ? ? ?

## 2021-11-01 ENCOUNTER — Ambulatory Visit (HOSPITAL_COMMUNITY): Payer: Medicare Other | Attending: Cardiology

## 2021-11-01 DIAGNOSIS — R0600 Dyspnea, unspecified: Secondary | ICD-10-CM | POA: Diagnosis not present

## 2021-11-01 DIAGNOSIS — Z01818 Encounter for other preprocedural examination: Secondary | ICD-10-CM | POA: Diagnosis present

## 2021-11-01 LAB — MYOCARDIAL PERFUSION IMAGING
Base ST Depression (mm): 0 mm
LV dias vol: 65 mL (ref 46–106)
LV sys vol: 19 mL
Nuc Stress EF: 71 %
Peak HR: 81 {beats}/min
Rest HR: 69 {beats}/min
Rest Nuclear Isotope Dose: 10.7 mCi
SDS: 1
SRS: 0
SSS: 1
ST Depression (mm): 0 mm
Stress Nuclear Isotope Dose: 31.8 mCi
TID: 1.07

## 2021-11-01 MED ORDER — REGADENOSON 0.4 MG/5ML IV SOLN
0.4000 mg | Freq: Once | INTRAVENOUS | Status: AC
  Administered 2021-11-01: 0.4 mg via INTRAVENOUS

## 2021-11-01 MED ORDER — TECHNETIUM TC 99M TETROFOSMIN IV KIT
10.7000 | PACK | Freq: Once | INTRAVENOUS | Status: AC | PRN
  Administered 2021-11-01: 10.7 via INTRAVENOUS
  Filled 2021-11-01: qty 11

## 2021-11-01 MED ORDER — TECHNETIUM TC 99M TETROFOSMIN IV KIT
31.8000 | PACK | Freq: Once | INTRAVENOUS | Status: AC | PRN
  Administered 2021-11-01: 31.8 via INTRAVENOUS
  Filled 2021-11-01: qty 32

## 2021-11-03 ENCOUNTER — Telehealth: Payer: Self-pay | Admitting: Orthopaedic Surgery

## 2021-11-03 NOTE — Telephone Encounter (Signed)
Please advise 

## 2021-11-03 NOTE — Telephone Encounter (Signed)
Patient called needing to know the results of her stress test? The number to contact patient is [phone] ?

## 2021-11-06 NOTE — Telephone Encounter (Signed)
I spoke with patient. She has CD of xrays that were made at the [facility]. She will bring these with her to her appointment. Appointment scheduled for 11/14/2021. ?

## 2021-11-06 NOTE — Telephone Encounter (Signed)
Patient has called in stating she has had all her heart test that Dr. Lorin Mercy requested so she could get her left knee replaced. Dr Lorin Mercy called today and she stated her knee was not better. Patient would like to know what are the next steps either another appt or surgery she would prefer the total knee replacement surgery.  Results for testing are in her Chart.  ?

## 2021-11-08 ENCOUNTER — Ambulatory Visit (INDEPENDENT_AMBULATORY_CARE_PROVIDER_SITE_OTHER): Payer: Medicare Other

## 2021-11-08 DIAGNOSIS — I495 Sick sinus syndrome: Secondary | ICD-10-CM | POA: Diagnosis not present

## 2021-11-09 LAB — CUP PACEART REMOTE DEVICE CHECK
Battery Remaining Percentage: 55 %
Brady Statistic RA Percent Paced: 58 %
Brady Statistic RV Percent Paced: 2 %
Date Time Interrogation Session: 20230518090510
Implantable Lead Implant Date: 20161123
Implantable Lead Implant Date: 20161123
Implantable Lead Location: 753859
Implantable Lead Location: 753860
Implantable Lead Model: 5076
Implantable Lead Model: 5076
Implantable Pulse Generator Implant Date: 20161123
Lead Channel Impedance Value: 332 Ohm
Lead Channel Impedance Value: 410 Ohm
Lead Channel Pacing Threshold Amplitude: 0.6 V
Lead Channel Pacing Threshold Amplitude: 1.3 V
Lead Channel Pacing Threshold Pulse Width: 0.4 ms
Lead Channel Pacing Threshold Pulse Width: 0.4 ms
Lead Channel Sensing Intrinsic Amplitude: 1.2 mV
Lead Channel Sensing Intrinsic Amplitude: 10.8 mV
Lead Channel Setting Pacing Amplitude: 2 V
Lead Channel Setting Pacing Amplitude: 2.4 V
Lead Channel Setting Pacing Pulse Width: 0.4 ms
Pulse Gen Model: 394969
Pulse Gen Serial Number: 68664772

## 2021-11-13 ENCOUNTER — Telehealth: Payer: Self-pay | Admitting: Radiology

## 2021-11-13 NOTE — Telephone Encounter (Signed)
I cancelled patient's appointment tomorrow. She will bring the CD of xrays with her to her H&P appointment. She knows if we are unable to pull everything from the CD that we need, Fayrene FearingJames will need to do xrays when she is here. She would like to schedule surgery as soon as possible as she is going to have to keep her grandchildren soon. She requests return call as soon as possible.

## 2021-11-13 NOTE — Telephone Encounter (Signed)
I left voicemail for patient requesting return call. Appointment was originally scheduled because Dr. Lorin Mercy felt we needed x-rays to discuss knee replacement. Patient has copy of xrays on CD and had appointment previously with Dr. Lorin Mercy where surgery was discussed. Dr. Lorin Mercy does not need to see patient provided she has copies of x-rays. She can bring those in with her when she sees Jeneen Rinks for pre-operative history and physical.

## 2021-11-14 ENCOUNTER — Ambulatory Visit: Payer: Medicare Other | Admitting: Orthopaedic Surgery

## 2021-11-14 NOTE — Telephone Encounter (Signed)
Pt is calling again asking about surgery

## 2021-11-15 NOTE — Telephone Encounter (Signed)
I called patient and scheduled surgery. 

## 2021-11-17 ENCOUNTER — Other Ambulatory Visit: Payer: Self-pay

## 2021-11-22 NOTE — Progress Notes (Signed)
Remote pacemaker transmission.   

## 2021-12-07 NOTE — Pre-Procedure Instructions (Signed)
Surgical Instructions    Your procedure is scheduled on Monday, June 26th.  Report to [facility] Main Entrance "A" at 10:30 A.M., then check in with the Admitting office.  Call this number if you have problems the morning of surgery:  [phone]   If you have any questions prior to your surgery date call [phone]: Open Monday-Friday 8am-4pm    Remember:  Do not eat after midnight the night before your surgery  You may drink clear liquids until 09:30 AM the morning of your surgery.   Clear liquids allowed are: Water, Non-Citrus Juices (without pulp), Carbonated Beverages, Clear Tea, Black Coffee Only (NO MILK, CREAM OR POWDERED CREAMER of any kind), and Gatorade.    Take these medicines the morning of surgery with A SIP OF WATER  alendronate (FOSAMAX)- if taken on this day metoprolol tartrate (LOPRESSOR) SYNTHROID   As of today, STOP taking any Aspirin (unless otherwise instructed by your surgeon) Aleve, Naproxen, Ibuprofen, Motrin, Advil, Goody's, BC's, all herbal medications, fish oil, and all vitamins. This includes your diclofenac (CATAFLAM).                     Do NOT Smoke (Tobacco/Vaping) for 24 hours prior to your procedure.  If you use a CPAP at night, you may bring your mask/headgear for your overnight stay.   Contacts, glasses, piercing's, hearing aid's, dentures or partials may not be worn into surgery, please bring cases for these belongings.    For patients admitted to the hospital, discharge time will be determined by your treatment team.   Patients discharged the day of surgery will not be allowed to drive home, and someone needs to stay with them for 24 hours.  SURGICAL WAITING ROOM VISITATION Patients having surgery or a procedure may have two support people in the waiting room. These visitors may be switched out with other visitors if needed. Children under the age of 44 must have an adult accompany them who is not the patient. If the patient needs to  stay at the hospital during part of their recovery, the visitor guidelines for inpatient rooms apply.  Please refer to the [facility] website for the visitor guidelines for Inpatients (after your surgery is over and you are in a regular room).    Special instructions:   Ord- Preparing For Surgery  Before surgery, you can play an important role. Because skin is not sterile, your skin needs to be as free of germs as possible. You can reduce the number of germs on your skin by washing with CHG (chlorahexidine gluconate) Soap before surgery.  CHG is an antiseptic cleaner which kills germs and bonds with the skin to continue killing germs even after washing.    Oral Hygiene is also important to reduce your risk of infection.  Remember - BRUSH YOUR TEETH THE MORNING OF SURGERY WITH YOUR REGULAR TOOTHPASTE  Please do not use if you have an allergy to CHG or antibacterial soaps. If your skin becomes reddened/irritated stop using the CHG.  Do not shave (including legs and underarms) for at least 48 hours prior to first CHG shower. It is OK to shave your face.  Please follow these instructions carefully.   Shower the NIGHT BEFORE SURGERY and the MORNING OF SURGERY  If you chose to wash your hair, wash your hair first as usual with your normal shampoo.  After you shampoo, rinse your hair and body thoroughly to remove the shampoo.  Use CHG Soap as you  would any other liquid soap. You can apply CHG directly to the skin and wash gently with a scrungie or a clean washcloth.   Apply the CHG Soap to your body ONLY FROM THE NECK DOWN.  Do not use on open wounds or open sores. Avoid contact with your eyes, ears, mouth and genitals (private parts). Wash Face and genitals (private parts)  with your normal soap.   Wash thoroughly, paying special attention to the area where your surgery will be performed.  Thoroughly rinse your body with warm water from the neck down.  DO NOT shower/wash with your  normal soap after using and rinsing off the CHG Soap.  Pat yourself dry with a CLEAN TOWEL.  Wear CLEAN PAJAMAS to bed the night before surgery  Place CLEAN SHEETS on your bed the night before your surgery  DO NOT SLEEP WITH PETS.   Day of Surgery: Take a shower with CHG soap. Do not wear jewelry or makeup Do not wear lotions, powders, perfumes, or deodorant. Do not shave 48 hours prior to surgery.   Do not bring valuables to the hospital.  [facility] is not responsible for any belongings or valuables. Do not wear nail polish, gel polish, artificial nails, or any other type of covering on natural nails (fingers and toes) If you have artificial nails or gel coating that need to be removed by a nail salon, please have this removed prior to surgery. Artificial nails or gel coating may interfere with anesthesia's ability to adequately monitor your vital signs. Wear Clean/Comfortable clothing the morning of surgery Remember to brush your teeth WITH YOUR REGULAR TOOTHPASTE.   Please read over the following fact sheets that you were given.    If you received a COVID test during your pre-op visit  it is requested that you wear a mask when out in public, stay away from anyone that may not be feeling well and notify your surgeon if you develop symptoms. If you have been in contact with anyone that has tested positive in the last 10 days please notify you surgeon.

## 2021-12-08 ENCOUNTER — Encounter (HOSPITAL_COMMUNITY)
Admission: RE | Admit: 2021-12-08 | Discharge: 2021-12-08 | Disposition: A | Discharge status: Home / Self Care | Payer: Medicare Other | Source: Ambulatory Visit | Attending: Orthopaedic Surgery | Admitting: Orthopaedic Surgery

## 2021-12-08 ENCOUNTER — Ambulatory Visit: Payer: Medicare Other | Admitting: Surgery

## 2021-12-08 ENCOUNTER — Other Ambulatory Visit: Payer: Self-pay

## 2021-12-08 ENCOUNTER — Encounter (HOSPITAL_COMMUNITY): Payer: Self-pay

## 2021-12-08 ENCOUNTER — Encounter: Payer: Self-pay | Admitting: Surgery

## 2021-12-08 ENCOUNTER — Encounter: Payer: Self-pay | Admitting: Cardiology

## 2021-12-08 VITALS — BP 151/97 | HR 78 | Temp 98.2°F | Resp 17 | Ht 66.0 in | Wt 141.2 lb

## 2021-12-08 VITALS — BP 152/77 | HR 62

## 2021-12-08 DIAGNOSIS — Z01818 Encounter for other preprocedural examination: Secondary | ICD-10-CM

## 2021-12-08 DIAGNOSIS — I10 Essential (primary) hypertension: Secondary | ICD-10-CM | POA: Diagnosis not present

## 2021-12-08 DIAGNOSIS — M1712 Unilateral primary osteoarthritis, left knee: Secondary | ICD-10-CM | POA: Diagnosis not present

## 2021-12-08 DIAGNOSIS — Z01812 Encounter for preprocedural laboratory examination: Secondary | ICD-10-CM | POA: Diagnosis present

## 2021-12-08 DIAGNOSIS — Z950 Presence of cardiac pacemaker: Secondary | ICD-10-CM | POA: Insufficient documentation

## 2021-12-08 DIAGNOSIS — K589 Irritable bowel syndrome without diarrhea: Secondary | ICD-10-CM | POA: Diagnosis not present

## 2021-12-08 DIAGNOSIS — E039 Hypothyroidism, unspecified: Secondary | ICD-10-CM | POA: Diagnosis not present

## 2021-12-08 DIAGNOSIS — I2510 Atherosclerotic heart disease of native coronary artery without angina pectoris: Secondary | ICD-10-CM

## 2021-12-08 HISTORY — DX: Presence of cardiac pacemaker: Z95.0

## 2021-12-08 HISTORY — DX: Cardiac arrhythmia, unspecified: I49.9

## 2021-12-08 LAB — CBC
HCT: 39.2 % (ref 36.0–46.0)
Hemoglobin: 13 g/dL (ref 12.0–15.0)
MCH: 29.3 pg (ref 26.0–34.0)
MCHC: 33.2 g/dL (ref 30.0–36.0)
MCV: 88.3 fL (ref 80.0–100.0)
Platelets: 167 10*3/uL (ref 150–400)
RBC: 4.44 MIL/uL (ref 3.87–5.11)
RDW: 12.7 % (ref 11.5–15.5)
WBC: 6.4 10*3/uL (ref 4.0–10.5)
nRBC: 0 % (ref 0.0–0.2)

## 2021-12-08 LAB — BASIC METABOLIC PANEL
Anion gap: 10 (ref 5–15)
BUN: 18 mg/dL (ref 8–23)
CO2: 27 mmol/L (ref 22–32)
Calcium: 9.5 mg/dL (ref 8.9–10.3)
Chloride: 104 mmol/L (ref 98–111)
Creatinine, Ser: 0.76 mg/dL (ref 0.44–1.00)
GFR, Estimated: >60 mL/min (ref >60)
Glucose, Bld: 101 mg/dL — ABNORMAL HIGH (ref 70–99)
Potassium: 3.7 mmol/L (ref 3.5–5.1)
Sodium: 141 mmol/L (ref 135–145)

## 2021-12-08 LAB — SURGICAL PCR SCREEN
MRSA, PCR: NEGATIVE
Staphylococcus aureus: NEGATIVE

## 2021-12-08 NOTE — Progress Notes (Signed)
Brent with [employer] called and notified of patients surgery. Date, time, and surgery information given. All questions answered.

## 2021-12-08 NOTE — Progress Notes (Signed)
PERIOPERATIVE PRESCRIPTION FOR IMPLANTED CARDIAC DEVICE PROGRAMMING  Patient Information: Name:  Autumn Benjamin  DOB:  08/13/1940  MRN:  [id number]    Planned Procedure:  Left Total Knee Arthroplasty  Surgeon:  Dr. Annell Greening  Date of Procedure:  12/18/2021  Cautery will be used.  Position during surgery:  Supine   Please send documentation back to:  [facility] (Fax # [phone])   Device Information:  Clinic EP Physician:  [name], MD   Device Type:  Pacemaker Manufacturer and Phone #:  B[employer]: [phone] Pacemaker Dependent?:  No. Date of Last Device Check:  10/30/2021 ~ Remote Normal Device Function?:  Yes.    Electrophysiologist's Recommendations:  Have magnet available. Provide continuous ECG monitoring when magnet is used or reprogramming is to be performed.  Procedure should not interfere with device function.  No device programming or magnet placement needed.  Per Device Clinic Standing Orders, Lenor Coffin, RN  11:37 AM 12/08/2021

## 2021-12-08 NOTE — Progress Notes (Addendum)
PCP - Zoe Lan, NP Cardiologist - Dr. Elberta Fortis  PPM/ICD - Yes. [employer] Device Orders - Sent via staff message Rep Notified - Yes. Brent with [employer]  Chest x-ray - 06/02/2015 EKG - 10/27/2021 Stress Test - 11/01/2021 ECHO - 06/04/2015 Cardiac Cath - denies  Sleep Study - denies CPAP - n/a  No DM  Blood Thinner Instructions: n/a Aspirin Instructions: n/a  ERAS Protcol - Yes. Clear liquids until 0930 the morning of surgery PRE-SURGERY Ensure or G2- n/a. None ordred  COVID TEST- n/a   Anesthesia review: Yes. PPM. Myocardial Perfusion Test 11/01/2021 to assess cardiac risk for this surgery. Per note, stress is normal.  Patient denies shortness of breath, fever, cough and chest pain at PAT appointment   All instructions explained to the patient, with a verbal understanding of the material. Patient agrees to go over the instructions while at home for a better understanding. Patient also instructed to self quarantine after being tested for COVID-19. The opportunity to ask questions was provided.

## 2021-12-08 NOTE — Progress Notes (Signed)
81 year old white female with history of end-stage DJD left knee and pain comes in for prep evaluation.  States that knee symptoms unchanged from previous visit.  She is want to proceed with left total knee replacement as scheduled December 18, 2021.  We have received preop cardiac clearance.  Today history physical performed.  Review of systems negative.     Plan Surgical procedure discussed in detail.  She states that she lives with her son, daughter-in-law and 2 young grandchildren.  She is wondering how much assistance that she will have when she is discharged in the hospital.  I advised her to speak with her family members about this.  Her son is working full-time.  She has been a patient at [facility] past for another medical issue.  Advised her that she could do her postop rehabilitation at that facility.  She will also discuss this with her family.  All questions answered.

## 2021-12-11 ENCOUNTER — Encounter (HOSPITAL_COMMUNITY): Payer: Self-pay

## 2021-12-11 ENCOUNTER — Telehealth: Payer: Self-pay
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
## 2021-12-11 NOTE — Telephone Encounter (Signed)
Patient contacted the office and states that she is scheduled to have a L TKA preformed on 12/18/2021. [facility] no longer accepts her insurance and would like to be sent to another place. Please advise.

## 2021-12-11 NOTE — Anesthesia Preprocedure Evaluation (Signed)
Anesthesia Evaluation    Airway        Dental   Pulmonary           Cardiovascular hypertension,      Neuro/Psych    GI/Hepatic   Endo/Other    Renal/GU      Musculoskeletal   Abdominal   Peds  Hematology   Anesthesia Other Findings   Reproductive/Obstetrics                             Anesthesia Physical Anesthesia Plan  ASA:   Anesthesia Plan:    Post-op Pain Management:    Induction:   PONV Risk Score and Plan:   Airway Management Planned:   Additional Equipment:   Intra-op Plan:   Post-operative Plan:   Informed Consent:   Plan Discussed with:   Anesthesia Plan Comments: (PAT note written 12/11/2021 by Shonna Chock, PA-C. )        Anesthesia Quick Evaluation

## 2021-12-18 ENCOUNTER — Other Ambulatory Visit: Payer: Self-pay

## 2021-12-18 ENCOUNTER — Encounter (HOSPITAL_COMMUNITY)
Admission: RE | Disposition: A | Discharge status: Home Health | Payer: Self-pay | Source: Home / Self Care | Attending: Orthopaedic Surgery

## 2021-12-18 ENCOUNTER — Ambulatory Visit (HOSPITAL_COMMUNITY): Payer: Medicare Other | Admitting: Vascular Surgery

## 2021-12-18 ENCOUNTER — Ambulatory Visit (HOSPITAL_BASED_OUTPATIENT_CLINIC_OR_DEPARTMENT_OTHER): Payer: Medicare Other | Admitting: Anesthesiology

## 2021-12-18 ENCOUNTER — Encounter (HOSPITAL_COMMUNITY): Payer: Self-pay | Admitting: Orthopaedic Surgery

## 2021-12-18 ENCOUNTER — Inpatient Hospital Stay (HOSPITAL_COMMUNITY)
Admission: RE | Admit: 2021-12-18 | Discharge: 2021-12-22 | DRG: 470 | Disposition: A | Discharge status: Home Health | Payer: Medicare Other | Attending: Orthopaedic Surgery | Admitting: Orthopaedic Surgery

## 2021-12-18 DIAGNOSIS — M1712 Unilateral primary osteoarthritis, left knee: Secondary | ICD-10-CM | POA: Diagnosis not present

## 2021-12-18 DIAGNOSIS — K589 Irritable bowel syndrome without diarrhea: Secondary | ICD-10-CM | POA: Diagnosis present

## 2021-12-18 DIAGNOSIS — E039 Hypothyroidism, unspecified: Secondary | ICD-10-CM

## 2021-12-18 DIAGNOSIS — Z96652 Presence of left artificial knee joint: Secondary | ICD-10-CM

## 2021-12-18 DIAGNOSIS — M069 Rheumatoid arthritis, unspecified: Secondary | ICD-10-CM | POA: Diagnosis present

## 2021-12-18 DIAGNOSIS — Z7983 Long term (current) use of bisphosphonates: Secondary | ICD-10-CM

## 2021-12-18 DIAGNOSIS — I471 Supraventricular tachycardia: Secondary | ICD-10-CM

## 2021-12-18 DIAGNOSIS — Z888 Allergy status to other drugs, medicaments and biological substances status: Secondary | ICD-10-CM

## 2021-12-18 DIAGNOSIS — I10 Essential (primary) hypertension: Secondary | ICD-10-CM | POA: Diagnosis present

## 2021-12-18 DIAGNOSIS — E559 Vitamin D deficiency, unspecified: Secondary | ICD-10-CM | POA: Diagnosis present

## 2021-12-18 DIAGNOSIS — Z79890 Hormone replacement therapy (postmenopausal): Secondary | ICD-10-CM

## 2021-12-18 DIAGNOSIS — Z833 Family history of diabetes mellitus: Secondary | ICD-10-CM

## 2021-12-18 DIAGNOSIS — Z91040 Latex allergy status: Secondary | ICD-10-CM

## 2021-12-18 DIAGNOSIS — I442 Atrioventricular block, complete: Secondary | ICD-10-CM | POA: Diagnosis present

## 2021-12-18 DIAGNOSIS — Z96659 Presence of unspecified artificial knee joint: Secondary | ICD-10-CM

## 2021-12-18 DIAGNOSIS — Z950 Presence of cardiac pacemaker: Secondary | ICD-10-CM

## 2021-12-18 DIAGNOSIS — Z825 Family history of asthma and other chronic lower respiratory diseases: Secondary | ICD-10-CM

## 2021-12-18 DIAGNOSIS — Z01818 Encounter for other preprocedural examination: Principal | ICD-10-CM

## 2021-12-18 DIAGNOSIS — Z885 Allergy status to narcotic agent status: Secondary | ICD-10-CM

## 2021-12-18 HISTORY — PX: TOTAL KNEE ARTHROPLASTY: SHX125

## 2021-12-18 SURGERY — ARTHROPLASTY, KNEE, TOTAL
Anesthesia: General | Site: Knee | Laterality: Left

## 2021-12-18 MED ORDER — LACTATED RINGERS IV SOLN: INTRAVENOUS | Status: DC

## 2021-12-18 MED ORDER — LEVOTHYROXINE SODIUM 100 MCG PO TABS
100.0000 ug | ORAL_TABLET | Freq: Every day | ORAL | Status: DC
  Administered 2021-12-19 – 2021-12-22 (×4): 100 ug via ORAL
  Filled 2021-12-18 (×4): qty 1

## 2021-12-18 MED ORDER — BUPIVACAINE LIPOSOME 1.3 % IJ SUSP
INTRAMUSCULAR | Status: AC
  Filled 2021-12-18: qty 20

## 2021-12-18 MED ORDER — DOCUSATE SODIUM 100 MG PO CAPS
100.0000 mg | ORAL_CAPSULE | Freq: Two times a day (BID) | ORAL | Status: DC
  Administered 2021-12-18 – 2021-12-22 (×5): 100 mg via ORAL
  Filled 2021-12-18 (×6): qty 1

## 2021-12-18 MED ORDER — PHENOL 1.4 % MT LIQD: 1.0000 | OROMUCOSAL | Status: DC | PRN

## 2021-12-18 MED ORDER — METOCLOPRAMIDE HCL 5 MG PO TABS: 5.0000 mg | ORAL_TABLET | Freq: Three times a day (TID) | ORAL | Status: DC | PRN

## 2021-12-18 MED ORDER — METHOCARBAMOL 500 MG PO TABS
500.0000 mg | ORAL_TABLET | Freq: Four times a day (QID) | ORAL | Status: DC | PRN
  Administered 2021-12-19 – 2021-12-21 (×6): 500 mg via ORAL
  Filled 2021-12-18 (×6): qty 1

## 2021-12-18 MED ORDER — BUPIVACAINE HCL (PF) 0.25 % IJ SOLN
INTRAMUSCULAR | Status: AC
  Filled 2021-12-18: qty 30

## 2021-12-18 MED ORDER — PHENYLEPHRINE HCL-NACL 20-0.9 MG/250ML-% IV SOLN
INTRAVENOUS | Status: DC | PRN
  Administered 2021-12-18: 15 ug/min via INTRAVENOUS

## 2021-12-18 MED ORDER — HYDROMORPHONE HCL 1 MG/ML IJ SOLN
0.5000 mg | INTRAMUSCULAR | Status: DC | PRN
  Administered 2021-12-19: 0.5 mg via INTRAVENOUS
  Filled 2021-12-18: qty 0.5

## 2021-12-18 MED ORDER — METOCLOPRAMIDE HCL 5 MG/ML IJ SOLN: 5.0000 mg | Freq: Three times a day (TID) | INTRAMUSCULAR | Status: DC | PRN

## 2021-12-18 MED ORDER — SODIUM CHLORIDE 0.9 % IV SOLN: INTRAVENOUS | Status: DC

## 2021-12-18 MED ORDER — TRANEXAMIC ACID-NACL 1000-0.7 MG/100ML-% IV SOLN
INTRAVENOUS | Status: AC
  Filled 2021-12-18: qty 200

## 2021-12-18 MED ORDER — FENTANYL CITRATE (PF) 100 MCG/2ML IJ SOLN
INTRAMUSCULAR | Status: AC
  Filled 2021-12-18: qty 2

## 2021-12-18 MED ORDER — FENTANYL CITRATE (PF) 250 MCG/5ML IJ SOLN
INTRAMUSCULAR | Status: AC
  Filled 2021-12-18: qty 5

## 2021-12-18 MED ORDER — FENTANYL CITRATE (PF) 100 MCG/2ML IJ SOLN
25.0000 ug | INTRAMUSCULAR | Status: DC | PRN
  Administered 2021-12-18: 50 ug via INTRAVENOUS
  Administered 2021-12-18: 25 ug via INTRAVENOUS

## 2021-12-18 MED ORDER — METOPROLOL TARTRATE 50 MG PO TABS
50.0000 mg | ORAL_TABLET | Freq: Two times a day (BID) | ORAL | Status: DC
  Administered 2021-12-18 – 2021-12-22 (×7): 50 mg via ORAL
  Filled 2021-12-18 (×7): qty 1

## 2021-12-18 MED ORDER — POLYETHYLENE GLYCOL 3350 17 G PO PACK: 17.0000 g | PACK | Freq: Every day | ORAL | Status: DC | PRN

## 2021-12-18 MED ORDER — ALENDRONATE SODIUM 70 MG PO TABS: 70.0000 mg | ORAL_TABLET | ORAL | Status: DC

## 2021-12-18 MED ORDER — ONDANSETRON HCL 4 MG/2ML IJ SOLN
INTRAMUSCULAR | Status: DC | PRN
  Administered 2021-12-18: 4 mg via INTRAVENOUS

## 2021-12-18 MED ORDER — ASPIRIN 325 MG PO TBEC
325.0000 mg | DELAYED_RELEASE_TABLET | Freq: Every day | ORAL | Status: DC
  Administered 2021-12-19 – 2021-12-22 (×4): 325 mg via ORAL
  Filled 2021-12-18 (×4): qty 1

## 2021-12-18 MED ORDER — OXYCODONE HCL 5 MG PO TABS
5.0000 mg | ORAL_TABLET | ORAL | Status: DC | PRN
  Administered 2021-12-19 – 2021-12-21 (×7): 5 mg via ORAL
  Filled 2021-12-18 (×7): qty 1

## 2021-12-18 MED ORDER — FENTANYL CITRATE (PF) 100 MCG/2ML IJ SOLN: 100.0000 ug | Freq: Once | INTRAMUSCULAR | Status: AC

## 2021-12-18 MED ORDER — BUPIVACAINE HCL (PF) 0.25 % IJ SOLN
INTRAMUSCULAR | Status: DC | PRN
  Administered 2021-12-18: 20 mL

## 2021-12-18 MED ORDER — DEXAMETHASONE SODIUM PHOSPHATE 10 MG/ML IJ SOLN
INTRAMUSCULAR | Status: DC | PRN
  Administered 2021-12-18: 5 mg

## 2021-12-18 MED ORDER — METHOCARBAMOL 1000 MG/10ML IJ SOLN: 500.0000 mg | Freq: Four times a day (QID) | INTRAVENOUS | Status: DC | PRN

## 2021-12-18 MED ORDER — STERILE WATER FOR IRRIGATION IR SOLN
Status: DC | PRN
  Administered 2021-12-18: 1000 mL

## 2021-12-18 MED ORDER — FENTANYL CITRATE (PF) 100 MCG/2ML IJ SOLN
INTRAMUSCULAR | Status: AC
  Administered 2021-12-18: 100 ug via INTRAVENOUS
  Filled 2021-12-18: qty 2

## 2021-12-18 MED ORDER — CEFAZOLIN SODIUM-DEXTROSE 2-4 GM/100ML-% IV SOLN
2.0000 g | INTRAVENOUS | Status: AC
  Administered 2021-12-18: 2 g via INTRAVENOUS
  Filled 2021-12-18: qty 100

## 2021-12-18 MED ORDER — BUPIVACAINE LIPOSOME 1.3 % IJ SUSP
INTRAMUSCULAR | Status: DC | PRN
  Administered 2021-12-18: 20 mL

## 2021-12-18 MED ORDER — MIDAZOLAM HCL 2 MG/2ML IJ SOLN
INTRAMUSCULAR | Status: AC
  Filled 2021-12-18: qty 2

## 2021-12-18 MED ORDER — CHLORHEXIDINE GLUCONATE 0.12 % MT SOLN
15.0000 mL | Freq: Once | OROMUCOSAL | Status: AC
  Administered 2021-12-18: 15 mL via OROMUCOSAL
  Filled 2021-12-18: qty 15

## 2021-12-18 MED ORDER — ROPIVACAINE HCL 7.5 MG/ML IJ SOLN
INTRAMUSCULAR | Status: DC | PRN
  Administered 2021-12-18: 20 mL via PERINEURAL

## 2021-12-18 MED ORDER — BUPIVACAINE IN DEXTROSE 0.75-8.25 % IT SOLN
INTRATHECAL | Status: DC | PRN
  Administered 2021-12-18: 1.8 mL via INTRATHECAL

## 2021-12-18 MED ORDER — ACETAMINOPHEN 500 MG PO TABS
1000.0000 mg | ORAL_TABLET | Freq: Once | ORAL | Status: AC
  Administered 2021-12-18: 1000 mg via ORAL
  Filled 2021-12-18: qty 2

## 2021-12-18 MED ORDER — ACETAMINOPHEN 325 MG PO TABS
325.0000 mg | ORAL_TABLET | Freq: Four times a day (QID) | ORAL | Status: DC | PRN
  Administered 2021-12-20 – 2021-12-21 (×2): 650 mg via ORAL
  Filled 2021-12-18 (×2): qty 2

## 2021-12-18 MED ORDER — PROPOFOL 500 MG/50ML IV EMUL
INTRAVENOUS | Status: DC | PRN
  Administered 2021-12-18: 75 ug/kg/min via INTRAVENOUS

## 2021-12-18 MED ORDER — AMISULPRIDE (ANTIEMETIC) 5 MG/2ML IV SOLN: 10.0000 mg | Freq: Once | INTRAVENOUS | Status: DC | PRN

## 2021-12-18 MED ORDER — SODIUM CHLORIDE 0.9 % IR SOLN
Status: DC | PRN
  Administered 2021-12-18: 1

## 2021-12-18 MED ORDER — ORAL CARE MOUTH RINSE: 15.0000 mL | Freq: Once | OROMUCOSAL | Status: AC

## 2021-12-18 MED ORDER — BUPIVACAINE LIPOSOME 1.3 % IJ SUSP: 20.0000 mL | Freq: Once | INTRAMUSCULAR | Status: DC

## 2021-12-18 MED ORDER — MENTHOL 3 MG MT LOZG
1.0000 | LOZENGE | OROMUCOSAL | Status: DC | PRN
Start: 2021-12-18 — End: 2021-12-22

## 2021-12-18 MED ORDER — TRANEXAMIC ACID-NACL 1000-0.7 MG/100ML-% IV SOLN
INTRAVENOUS | Status: DC | PRN
  Administered 2021-12-18: 1000 mg via INTRAVENOUS

## 2021-12-18 MED ORDER — CELECOXIB 200 MG PO CAPS
200.0000 mg | ORAL_CAPSULE | Freq: Once | ORAL | Status: AC
  Administered 2021-12-18: 200 mg via ORAL
  Filled 2021-12-18: qty 1

## 2021-12-18 SURGICAL SUPPLY — 78 items
ATTUNE MED DOME PAT 32 KNEE (Knees) ×1 IMPLANT
ATTUNE PS FEM LT SZ 4 CEM KNEE (Femur) ×1 IMPLANT
ATTUNE PSRP INSR SZ4 6 KNEE (Insert) ×1 IMPLANT
BAG COUNTER SPONGE SURGICOUNT (BAG) ×2 IMPLANT
BANDAGE ESMARK 6X9 LF (GAUZE/BANDAGES/DRESSINGS) ×1 IMPLANT
BASEPLATE TIBIAL ROTATING SZ 4 (Knees) ×1 IMPLANT
BLADE SAGITTAL 25.0X1.19X90 (BLADE) ×2 IMPLANT
BLADE SAW SGTL 13X75X1.27 (BLADE) ×2 IMPLANT
BNDG ELASTIC 4X5.8 VLCR NS LF (GAUZE/BANDAGES/DRESSINGS) ×2 IMPLANT
BNDG ELASTIC 4X5.8 VLCR STR LF (GAUZE/BANDAGES/DRESSINGS) ×2 IMPLANT
BNDG ELASTIC 6X10 VLCR STRL LF (GAUZE/BANDAGES/DRESSINGS) ×2 IMPLANT
BNDG ESMARK 6X9 LF (GAUZE/BANDAGES/DRESSINGS) ×2
BNDG GAUZE DERMACEA FLUFF (GAUZE/BANDAGES/DRESSINGS) ×1
BNDG GAUZE DERMACEA FLUFF 4 (GAUZE/BANDAGES/DRESSINGS) IMPLANT
BOWL SMART MIX CTS (DISPOSABLE) ×2 IMPLANT
CEMENT HV SMART SET (Cement) ×4 IMPLANT
CLEANER TIP ELECTROSURG 2X2 (MISCELLANEOUS) ×1 IMPLANT
COVER SURGICAL LIGHT HANDLE (MISCELLANEOUS) ×2 IMPLANT
CUFF TOURN SGL QUICK 34 (TOURNIQUET CUFF) ×2
CUFF TOURN SGL QUICK 42 (TOURNIQUET CUFF) IMPLANT
CUFF TRNQT CYL 34X4.125X (TOURNIQUET CUFF) ×1 IMPLANT
DRAPE HALF SHEET 40X57 (DRAPES) ×1 IMPLANT
DRAPE INCISE IOBAN 66X45 STRL (DRAPES) ×2 IMPLANT
DRAPE ORTHO SPLIT 77X108 STRL (DRAPES) ×4
DRAPE SURG ORHT 6 SPLT 77X108 (DRAPES) ×2 IMPLANT
DRAPE U-SHAPE 47X51 STRL (DRAPES) ×2 IMPLANT
DRSG MEPILEX BORDER 4X12 (GAUZE/BANDAGES/DRESSINGS) ×1 IMPLANT
DRSG PAD ABDOMINAL 8X10 ST (GAUZE/BANDAGES/DRESSINGS) ×2 IMPLANT
DURAPREP 26ML APPLICATOR (WOUND CARE) ×4 IMPLANT
ELECT REM PT RETURN 9FT ADLT (ELECTROSURGICAL) ×2
ELECTRODE REM PT RTRN 9FT ADLT (ELECTROSURGICAL) ×1 IMPLANT
FACESHIELD WRAPAROUND (MASK) ×4 IMPLANT
FACESHIELD WRAPAROUND OR TEAM (MASK) ×2 IMPLANT
GAUZE XEROFORM 5X9 LF (GAUZE/BANDAGES/DRESSINGS) ×2 IMPLANT
GLOVE BIOGEL PI IND STRL 8 (GLOVE) ×2 IMPLANT
GLOVE BIOGEL PI INDICATOR 8 (GLOVE) ×3
GLOVE ORTHO TXT STRL SZ7.5 (GLOVE) ×4 IMPLANT
GLOVE SURG SS PI 7.5 STRL IVOR (GLOVE) ×2 IMPLANT
GLOVE SURG SYN 7.5  E (GLOVE) ×6
GLOVE SURG SYN 7.5 E (GLOVE) ×3 IMPLANT
GLOVE SURG SYN 7.5 PF PI (GLOVE) IMPLANT
GOWN STRL REUS W/ TWL LRG LVL3 (GOWN DISPOSABLE) ×1 IMPLANT
GOWN STRL REUS W/ TWL XL LVL3 (GOWN DISPOSABLE) ×1 IMPLANT
GOWN STRL REUS W/TWL 2XL LVL3 (GOWN DISPOSABLE) ×2 IMPLANT
GOWN STRL REUS W/TWL LRG LVL3 (GOWN DISPOSABLE) ×2
GOWN STRL REUS W/TWL XL LVL3 (GOWN DISPOSABLE) ×2
HANDPIECE INTERPULSE COAX TIP (DISPOSABLE) ×2
IMMOBILIZER KNEE 22 UNIV (SOFTGOODS) ×2 IMPLANT
KIT BASIN OR (CUSTOM PROCEDURE TRAY) ×2 IMPLANT
KIT TURNOVER KIT B (KITS) ×2 IMPLANT
MANIFOLD NEPTUNE II (INSTRUMENTS) ×2 IMPLANT
MARKER SKIN DUAL TIP RULER LAB (MISCELLANEOUS) ×2 IMPLANT
NDL 18GX1X1/2 (RX/OR ONLY) (NEEDLE) ×1 IMPLANT
NDL HYPO 25GX1X1/2 BEV (NEEDLE) ×1 IMPLANT
NEEDLE 18GX1X1/2 (RX/OR ONLY) (NEEDLE) ×2 IMPLANT
NEEDLE HYPO 25GX1X1/2 BEV (NEEDLE) ×2 IMPLANT
NS IRRIG 1000ML POUR BTL (IV SOLUTION) ×2 IMPLANT
PACK TOTAL JOINT (CUSTOM PROCEDURE TRAY) ×2 IMPLANT
PAD ARMBOARD 7.5X6 YLW CONV (MISCELLANEOUS) ×4 IMPLANT
PADDING CAST COTTON 6X4 STRL (CAST SUPPLIES) ×2 IMPLANT
PENCIL BUTTON HOLSTER BLD 10FT (ELECTRODE) ×1 IMPLANT
SET HNDPC FAN SPRY TIP SCT (DISPOSABLE) ×1 IMPLANT
STAPLER VISISTAT 35W (STAPLE) ×2 IMPLANT
SUCTION FRAZIER HANDLE 10FR (MISCELLANEOUS) ×2
SUCTION TUBE FRAZIER 10FR DISP (MISCELLANEOUS) ×1 IMPLANT
SUT VIC AB 0 CT1 27 (SUTURE) ×2
SUT VIC AB 0 CT1 27XBRD ANBCTR (SUTURE) ×1 IMPLANT
SUT VIC AB 1 CTX 36 (SUTURE) ×4
SUT VIC AB 1 CTX36XBRD ANBCTR (SUTURE) ×2 IMPLANT
SUT VIC AB 2-0 CT1 27 (SUTURE) ×4
SUT VIC AB 2-0 CT1 TAPERPNT 27 (SUTURE) ×2 IMPLANT
SYR 50ML LL SCALE MARK (SYRINGE) ×3 IMPLANT
SYR CONTROL 10ML LL (SYRINGE) ×2 IMPLANT
TOWEL GREEN STERILE (TOWEL DISPOSABLE) ×2 IMPLANT
TOWEL GREEN STERILE FF (TOWEL DISPOSABLE) ×2 IMPLANT
TRAY CATH 16FR W/PLASTIC CATH (SET/KITS/TRAYS/PACK) IMPLANT
TRAY CATH INTERMITTENT SS 16FR (CATHETERS) ×1 IMPLANT
YANKAUER SUCT BULB TIP NO VENT (SUCTIONS) ×1 IMPLANT

## 2021-12-18 NOTE — Interval H&P Note (Signed)
History and Physical Interval Note:  12/18/2021 12:25 PM  Autumn Benjamin  has presented today for surgery, with the diagnosis of left knee osteoarthritis.  The various methods of treatment have been discussed with the patient and family. After consideration of risks, benefits and other options for treatment, the patient has consented to  Procedure(s): LEFT TOTAL KNEE ARTHROPLASTY (Left) as a surgical intervention.  The patient's history has been reviewed, patient examined, no change in status, stable for surgery.  I have reviewed the patient's chart and labs.  Questions were answered to the patient's satisfaction.     Eldred Manges

## 2021-12-18 NOTE — Anesthesia Procedure Notes (Signed)
Anesthesia Regional Block: Adductor canal block   Pre-Anesthetic Checklist: , timeout performed,  Correct Patient, Correct Site, Correct Laterality,  Correct Procedure, Correct Position, site marked,  Risks and benefits discussed,  Surgical consent,  Pre-op evaluation,  At surgeon's request and post-op pain management  Laterality: Left  Prep: chloraprep       Needles:  Injection technique: Single-shot  Needle Type: Stimulator Needle - 80     Needle Length: 10cm  Needle Gauge: 21     Additional Needles:   Narrative:  Start time: 12/18/2021 11:54 AM End time: 12/18/2021 12:04 PM Injection made incrementally with aspirations every 5 mL.  Performed by: Personally  Anesthesiologist: Heather Roberts, MD

## 2021-12-18 NOTE — Op Note (Signed)
Preop diagnosis: Left knee primary osteoarthritis  Postop diagnosis: Same  Procedure: Left total knee arthroplasty.  Surgeon: Annell Greening, MD  Assistant: Zonia Kief, PA-C medically necessary and present for the entire procedure  Anesthesia spinal plus Exparel plus local and preoperative abductor block.  EBL: Less than 100 cc  Tourniquet 300 times less than 45 minutes.  Implants:Implants  Implants  CEMENT HV SMART SET - [id number]  Inventory Item: CEMENT HV SMART SET Serial no.:  Model/Cat no.: [id number]  Implant name: CEMENT HV SMART SET - [id number] Laterality: Left Area: Knee  Manufacturer: [employer] Date of Manufacture:    Action: Implanted Number Used: 1   Device Identifier:  Device Identifier Type:     CEMENT HV SMART SET - [id number]  Inventory Item: CEMENT HV SMART SET Serial no.:  Model/Cat no.: [id number]  Implant name: CEMENT HV SMART SET - [id number] Laterality: Left Area: Knee  Manufacturer: [employer] Date of Manufacture:    Action: Implanted Number Used: 1   Device Identifier:  Device Identifier Type:     Attune Patella Medialized Dome 32 mm  Inventory Item:  Serial no.:  Model/Cat no.:   Implant name: Attune Patella Medialized Dome 32 mm Laterality: Left Area: Knee  Manufacturer:  Date of Manufacture:    Action: Implanted Number Used: 1   Device Identifier:  Device Identifier Type:     Attune Femoral Posterior Stabilized Size 4 Left Cemented  Inventory Item:  Serial no.:  Model/Cat no.:   Implant name: Attune Femoral Posterior Stabilized Size 4 Left Cemented Laterality: Left Area: Knee  Manufacturer:  Date of Manufacture:    Action: Implanted Number Used: 1   Device Identifier:  Device Identifier Type:     Attune Knee System Tibial Base Rotating Platform   Inventory Item:  Serial no.:  Model/Cat no.:   Implant name: Attune Knee System Tibial Base Rotating Platform  Laterality: Left Area: Knee  Manufacturer:  Date of Manufacture:     Action: Implanted Number Used: 1   Device Identifier:  Device Identifier Type:     Attune Tibial Insert Rotating Platform Posterior Stabilized size 4, 6 mm  Inventory Item:  Serial no.:  Model/Cat no.:   Implant name: Attune Tibial Insert Rotating Platform Posterior Stabilized size 4, 6 mm Laterality: Left Area: Knee  Manufacturer:  Date of Manufacture:    Action: Implanted Number Used: 1   Device Identifier:  Device Identifier Type:    Procedure: After induction of abductor block spinal anesthesia supine position proximal thigh tourniquet lateral post heel bump prepping from the tip of the toes the tourniquet DuraPrep preoperative Ancef prophylaxis IV TXA.  Usual total knee sheets drapes sterile skin marker impervious stockinette Coban and Betadine Steri-Drape sealing the skin was applied.  After timeout procedure leg was wrapped with an Esmarch and inflated.  Midline incision was made medial superficial retinaculum after marking the capsule with purple skin marker for later closure.  Patella everted 10 mm resected.  Intramedullary hole drilled in the femur 5 mm cut was made valgus on the femur and 10 mm resected off the femur.  10 mm resected on the tibia and 5 and 6 mm spacer block fairly nicely.  Chamfer cuts box cuts were made for size for the femur for the tibia.  Record osteotome used to remove posterior spur primarily in the medial compartment meniscal remnants were resected.  Patient had tricompartment degenerative arthritis marginal osteophytes and some areas of grade IV chondromalacia.  Holes were drilled and  the lug nuts for the trial as well as in the patella.  Pulse lavage back to mixing cement and cementing of the tibia followed by femur placing the permanent 6 mm rotating platform that tension better than the 5 mm trial.  Patella was held with self-retaining clamp and all excessive cement was removed.  Exparel Marcaine was infiltrated while cement was setting up.  After cement was  hardened 15 minutes tourniquet deflated hemostasis obtained in standard layered closure postoperative dressing and knee immobilizer.

## 2021-12-19 ENCOUNTER — Encounter (HOSPITAL_COMMUNITY): Payer: Self-pay | Admitting: Orthopaedic Surgery

## 2021-12-19 LAB — CBC
HCT: 30.2 % — ABNORMAL LOW (ref 36.0–46.0)
Hemoglobin: 10.2 g/dL — ABNORMAL LOW (ref 12.0–15.0)
MCH: 29.2 pg (ref 26.0–34.0)
MCHC: 33.8 g/dL (ref 30.0–36.0)
MCV: 86.5 fL (ref 80.0–100.0)
Platelets: 152 10*3/uL (ref 150–400)
RBC: 3.49 MIL/uL — ABNORMAL LOW (ref 3.87–5.11)
RDW: 12.9 % (ref 11.5–15.5)
WBC: 9.8 10*3/uL (ref 4.0–10.5)
nRBC: 0 % (ref 0.0–0.2)

## 2021-12-19 LAB — BASIC METABOLIC PANEL
Anion gap: 7 (ref 5–15)
BUN: 17 mg/dL (ref 8–23)
CO2: 24 mmol/L (ref 22–32)
Calcium: 8.8 mg/dL — ABNORMAL LOW (ref 8.9–10.3)
Chloride: 108 mmol/L (ref 98–111)
Creatinine, Ser: 0.72 mg/dL (ref 0.44–1.00)
GFR, Estimated: >60 mL/min (ref >60)
Glucose, Bld: 139 mg/dL — ABNORMAL HIGH (ref 70–99)
Potassium: 4.1 mmol/L (ref 3.5–5.1)
Sodium: 139 mmol/L (ref 135–145)

## 2021-12-20 DIAGNOSIS — Z950 Presence of cardiac pacemaker: Secondary | ICD-10-CM | POA: Diagnosis not present

## 2021-12-20 DIAGNOSIS — Z7983 Long term (current) use of bisphosphonates: Secondary | ICD-10-CM | POA: Diagnosis not present

## 2021-12-20 DIAGNOSIS — I10 Essential (primary) hypertension: Secondary | ICD-10-CM | POA: Diagnosis present

## 2021-12-20 DIAGNOSIS — M069 Rheumatoid arthritis, unspecified: Secondary | ICD-10-CM | POA: Diagnosis present

## 2021-12-20 DIAGNOSIS — K589 Irritable bowel syndrome without diarrhea: Secondary | ICD-10-CM | POA: Diagnosis present

## 2021-12-20 DIAGNOSIS — Z825 Family history of asthma and other chronic lower respiratory diseases: Secondary | ICD-10-CM | POA: Diagnosis not present

## 2021-12-20 DIAGNOSIS — Z833 Family history of diabetes mellitus: Secondary | ICD-10-CM | POA: Diagnosis not present

## 2021-12-20 DIAGNOSIS — Z96659 Presence of unspecified artificial knee joint: Secondary | ICD-10-CM

## 2021-12-20 DIAGNOSIS — I442 Atrioventricular block, complete: Secondary | ICD-10-CM | POA: Diagnosis present

## 2021-12-20 DIAGNOSIS — Z96652 Presence of left artificial knee joint: Secondary | ICD-10-CM

## 2021-12-20 DIAGNOSIS — Z91040 Latex allergy status: Secondary | ICD-10-CM | POA: Diagnosis not present

## 2021-12-20 DIAGNOSIS — M1712 Unilateral primary osteoarthritis, left knee: Secondary | ICD-10-CM | POA: Diagnosis present

## 2021-12-20 DIAGNOSIS — Z885 Allergy status to narcotic agent status: Secondary | ICD-10-CM | POA: Diagnosis not present

## 2021-12-20 DIAGNOSIS — E039 Hypothyroidism, unspecified: Secondary | ICD-10-CM | POA: Diagnosis present

## 2021-12-20 DIAGNOSIS — E559 Vitamin D deficiency, unspecified: Secondary | ICD-10-CM | POA: Diagnosis present

## 2021-12-20 DIAGNOSIS — Z888 Allergy status to other drugs, medicaments and biological substances status: Secondary | ICD-10-CM | POA: Diagnosis not present

## 2021-12-20 DIAGNOSIS — Z79890 Hormone replacement therapy (postmenopausal): Secondary | ICD-10-CM | POA: Diagnosis not present

## 2021-12-20 LAB — BASIC METABOLIC PANEL
Anion gap: 9 (ref 5–15)
BUN: 22 mg/dL (ref 8–23)
CO2: 22 mmol/L (ref 22–32)
Calcium: 8.3 mg/dL — ABNORMAL LOW (ref 8.9–10.3)
Chloride: 108 mmol/L (ref 98–111)
Creatinine, Ser: 0.89 mg/dL (ref 0.44–1.00)
GFR, Estimated: >60 mL/min (ref >60)
Glucose, Bld: 136 mg/dL — ABNORMAL HIGH (ref 70–99)
Potassium: 5.1 mmol/L (ref 3.5–5.1)
Sodium: 139 mmol/L (ref 135–145)

## 2021-12-20 LAB — CBC
HCT: 26.1 % — ABNORMAL LOW (ref 36.0–46.0)
Hemoglobin: 8.5 g/dL — ABNORMAL LOW (ref 12.0–15.0)
MCH: 28.9 pg (ref 26.0–34.0)
MCHC: 32.6 g/dL (ref 30.0–36.0)
MCV: 88.8 fL (ref 80.0–100.0)
Platelets: 106 10*3/uL — ABNORMAL LOW (ref 150–400)
RBC: 2.94 MIL/uL — ABNORMAL LOW (ref 3.87–5.11)
RDW: 13.2 % (ref 11.5–15.5)
WBC: 5.8 10*3/uL (ref 4.0–10.5)
nRBC: 0 % (ref 0.0–0.2)

## 2021-12-20 NOTE — Progress Notes (Signed)
Pt. Placed in CPM

## 2021-12-20 NOTE — Progress Notes (Signed)
Patient ID: Autumn Benjamin, female   DOB: 26-Jun-1940, 81 y.o.   MRN: [id number]   Subjective: 2 Days Post-Op Procedure(s) (LRB): LEFT TOTAL KNEE ARTHROPLASTY (Left) Patient reports pain as mild and moderate.    Objective: Vital signs in last 24 hours: Temp:  [98 F (36.7 C)-98.4 F (36.9 C)] 98 F (36.7 C) (06/28 0745) Pulse Rate:  [70-96] 70 (06/28 0745) Resp:  [16-18] 18 (06/28 0500) BP: (118-154)/(56-68) 120/56 (06/28 0745) SpO2:  [94 %-98 %] 95 % (06/28 0745)  Intake/Output from previous day: 06/27 0701 - 06/28 0700 In: 360 [P.O.:360] Out: 350 [Urine:350] Intake/Output this shift: No intake/output data recorded.  Recent Labs    12/19/21 0422 12/20/21 0702  HGB 10.2* 8.5*   Recent Labs    12/19/21 0422 12/20/21 0702  WBC 9.8 5.8  RBC 3.49* 2.94*  HCT 30.2* 26.1*  PLT 152 106*   Recent Labs    12/19/21 0422 12/20/21 0335  NA 139 139  K 4.1 5.1  CL 108 108  CO2 24 22  BUN 17 22  CREATININE 0.72 0.89  GLUCOSE 139* 136*  CALCIUM 8.8* 8.3*   No results for input(s): "LABPT", "INR" in the last 72 hours.  Neurologically intact No results found.  Assessment/Plan: 2 Days Post-Op Procedure(s) (LRB): LEFT TOTAL KNEE ARTHROPLASTY (Left) Up with therapy, not safely mobile enough with decreased BP when she gets up and nausea.   Possible home tomorrow afternoon. Changed to admit status.   Eldred Manges 12/20/2021, 3:14 PM

## 2021-12-20 NOTE — Progress Notes (Signed)
Physical Therapy Treatment Note  (Full note to follow)  Continuing work on functional mobility and activity tolerance;  This session is very different from yesterday afternoon; Very painful this am (had pain meds earlier), but willing to try and walk; Walked a few steps, then reported nausea, so  opted to sit; Obtained serial vitals, including trying a standing BP, which did not read while standing; BP sitting immediately after the short walk was 98/85, BP likely lower in standing (when couldn't get a read); Pt's nurse notified;   Given this session's pain level, decr activity tolerance, and concern for presyncopal symptoms, I'm concerned re: possible dc today; worth considering another night inpatient.    12/20/21 0930 12/20/21 0935 12/20/21 0940  Vital Signs  Patient Position (if appropriate) Orthostatic Vitals  --   --   Orthostatic Lying   BP- Lying 126/59 (MAP 79)  --   --   Pulse- Lying 73  --   --   Orthostatic Sitting  BP- Sitting 111/61 (Map 77) 98/85 (MAP 91) 123/61 (MAP 73)  Pulse- Sitting 71 71 (Immediately after sitting, reclined, feet up) 63 (During L knee therex; Quite painful with flexion)  Orthostatic Standing at 0 minutes  BP- Standing at 0 minutes  (Unable to obtain standing BP)  --   --   Pulse- Standing at 0 minutes  --   --   --   Orthostatic Standing at 3 minutes  BP- Standing at 3 minutes  (Unable to obtain; Nausea, faint)  --   --     12/20/21 0945  Vital Signs  Patient Position (if appropriate)  --   Orthostatic Lying   BP- Lying  --   Pulse- Lying  --   Orthostatic Sitting  BP- Sitting  --   Pulse- Sitting  --   Orthostatic Standing at 0 minutes  BP- Standing at 0 minutes 110/57 (MAP 74)  Pulse- Standing at 0 minutes 72 (Standing at 0 minutes after seated therex)  Orthostatic Standing at 3 minutes  BP- Standing at 3 minutes  --    Van Clines, PT  Acute Rehabilitation Services Office [phone]
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
## 2021-12-20 NOTE — Care Management Obs Status (Signed)
MEDICARE OBSERVATION STATUS NOTIFICATION   Patient Details  Name: Autumn Benjamin MRN: [id number] Date of Birth: 10/26/1940   Medicare Observation Status Notification Given:  Yes    Epifanio Lesches, RN 12/20/2021, 12:02 PM
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
## 2021-12-21 NOTE — Progress Notes (Signed)
Patient ID: Autumn Benjamin, female   DOB: 10/18/1940, 81 y.o.   MRN: [id number] Slow progress with therapy we will see how she does today hopefully if she is mobile enough for safe ambulation we can consider discharge with outpatient therapy 3 times a week for her knee.  Patient is not ready and safe for discharge she will have to stay 1 more day.

## 2021-12-22 MED ORDER — OXYCODONE-ACETAMINOPHEN 5-325 MG PO TABS: 1.0000 | ORAL_TABLET | ORAL | 0 refills | Status: DC | PRN

## 2021-12-22 MED ORDER — ASPIRIN 325 MG PO TBEC: 325.0000 mg | DELAYED_RELEASE_TABLET | Freq: Every day | ORAL | Status: AC

## 2021-12-22 NOTE — TOC Transition Note (Signed)
Transition of Care [facility]) - CM/SW Discharge Note   Patient Details  Name: Autumn Benjamin MRN: [id number] Date of Birth: 09-08-1940  [facility] CM/SW Contact:  Epifanio Lesches, RN Phone Number: 12/22/2021, 10:55 AM   Clinical Narrative:    Patient will DC to: home Anticipated DC date: 12/22/2021 Family notified: yes, son Transport by: car   Per MD patient ready for DC today. RN, patient, patient's family, and [facility] notified of DC.  Pt states family will assist with needs once d/c to home. DME @ bedside to go home with, RW, BSC.  Pt without Rx meds  concerns. Pt states  son to provide transportation to home. Post hospital f/u noted on AVS.   RNCM will sign off for now as intervention is no longer needed. Please consult [place] again if new needs arise.    Final next level of care: Home w Home Health Services Barriers to Discharge: No Barriers Identified   Patient Goals and CMS Choice     Choice offered to / list presented to : Patient  Discharge Placement                       Discharge Plan and Services   Discharge Planning Services: CM Consult            DME Arranged: 3-N-1, Walker rolling DME Agency: [employer] Date DME Agency Contacted: 12/20/21 Time DME Agency Contacted: 1258 Representative spoke with at DME Agency: Beola Cord HH Arranged: PT, OT, Social Work [employer] Agency: [facility] Date HH Agency Contacted: 12/20/21 Time HH Agency Contacted: 1254 Representative spoke with at [facility] Agency: Morrie Sheldon  Social Determinants of Health (SDOH) Interventions     Readmission Risk Interventions     No data to display
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
## 2021-12-22 NOTE — Care Management Important Message (Signed)
Important Message  Patient Details  Name: Autumn Benjamin MRN: [id number] Date of Birth: 09-23-1940   Medicare Important Message Given:  Yes     Nolawi Kanady 12/22/2021, 4:20 PM

## 2021-12-22 NOTE — Discharge Instructions (Signed)
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
## 2021-12-22 NOTE — Progress Notes (Signed)
Patient given discharge instructions and stated understanding. 

## 2021-12-22 NOTE — Plan of Care (Signed)
  Problem: Education: Goal: Knowledge of General Education information will improve Description: Including pain rating scale, medication(s)/side effects and non-pharmacologic comfort measures Outcome: Adequate for Discharge   Problem: Health Behavior/Discharge Planning: Goal: Ability to manage health-related needs will improve Outcome: Adequate for Discharge   Problem: Clinical Measurements: Goal: Ability to maintain clinical measurements within normal limits will improve Outcome: Adequate for Discharge Goal: Will remain free from infection Outcome: Adequate for Discharge Goal: Diagnostic test results will improve Outcome: Adequate for Discharge Goal: Respiratory complications will improve Outcome: Adequate for Discharge Goal: Cardiovascular complication will be avoided Outcome: Adequate for Discharge   Problem: Activity: Goal: Risk for activity intolerance will decrease Outcome: Adequate for Discharge   Problem: Nutrition: Goal: Adequate nutrition will be maintained Outcome: Adequate for Discharge   Problem: Coping: Goal: Level of anxiety will decrease Outcome: Adequate for Discharge   Problem: Elimination: Goal: Will not experience complications related to bowel motility Outcome: Adequate for Discharge Goal: Will not experience complications related to urinary retention Outcome: Adequate for Discharge   Problem: Pain Managment: Goal: General experience of comfort will improve Outcome: Adequate for Discharge   Problem: Safety: Goal: Ability to remain free from injury will improve Outcome: Adequate for Discharge   Problem: Skin Integrity: Goal: Risk for impaired skin integrity will decrease Outcome: Adequate for Discharge   Problem: Acute Rehab PT Goals(only PT should resolve) Goal: Pt Will Go Supine/Side To Sit Outcome: Adequate for Discharge Goal: Pt Will Go Sit To Supine/Side Outcome: Adequate for Discharge Goal: Patient Will Transfer Sit To/From  Stand Outcome: Adequate for Discharge Goal: Pt Will Ambulate Outcome: Adequate for Discharge Goal: Pt Will Go Up/Down Stairs Outcome: Adequate for Discharge   Problem: Acute Rehab OT Goals (only OT should resolve) Goal: Pt. Will Perform Lower Body Bathing Outcome: Adequate for Discharge Goal: Pt. Will Perform Lower Body Dressing Outcome: Adequate for Discharge Goal: Pt. Will Transfer To Toilet Outcome: Adequate for Discharge Goal: Pt. Will Perform Tub/Shower Transfer Outcome: Adequate for Discharge

## 2021-12-22 NOTE — Progress Notes (Addendum)
Patient ID: Autumn Benjamin, female   DOB: 07-Aug-1940, 81 y.o.   MRN: [id number] Talk with patient's son and discussed with patient keeping knee immobilizer on all the time.  She cannot do the type distractor laterally to bend and then when she stands back up with her walker she can just fix the type II straps.  Son has my cell phone number.  Discharge today after which she works with therapy x2.  Home health PT 3 times a week x2 weeks then will transition to outpatient physical therapy.

## 2021-12-25 ENCOUNTER — Telehealth: Payer: Self-pay

## 2021-12-25 NOTE — Telephone Encounter (Signed)
Patient would like to know how much aspirin does she need to take?  Stated that she is taking a low dose [employer] Aspirin.   Patient had left knee surgery on 12/18/2021. Cb# [phone].  Please advise.  Thank you.

## 2021-12-27 ENCOUNTER — Telehealth: Payer: Self-pay | Admitting: Orthopaedic Surgery

## 2021-12-27 ENCOUNTER — Ambulatory Visit (INDEPENDENT_AMBULATORY_CARE_PROVIDER_SITE_OTHER): Payer: Medicare Other

## 2021-12-27 ENCOUNTER — Other Ambulatory Visit: Payer: Self-pay

## 2021-12-27 ENCOUNTER — Ambulatory Visit (INDEPENDENT_AMBULATORY_CARE_PROVIDER_SITE_OTHER): Payer: Medicare Other | Admitting: Orthopaedic Surgery

## 2021-12-27 VITALS — BP 143/84 | HR 81

## 2021-12-27 DIAGNOSIS — Z96652 Presence of left artificial knee joint: Secondary | ICD-10-CM

## 2021-12-27 DIAGNOSIS — M1712 Unilateral primary osteoarthritis, left knee: Secondary | ICD-10-CM

## 2021-12-27 NOTE — Telephone Encounter (Signed)
I called and gave verbal ok. She stated she did PT for pt on Sunday and will return tomorrow to continue

## 2021-12-27 NOTE — Telephone Encounter (Signed)
Patient needs post op visit for 2 weeks nothing open she needs staples removed

## 2021-12-27 NOTE — Telephone Encounter (Signed)
Cecila called. She is the PT working in home with patient. She would like verbal orders 2x/wk for 3wks. Her call back number is [phone]
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
## 2021-12-27 NOTE — Telephone Encounter (Signed)
Per note pt need to been seen next week for staple removal. Pt was worked in

## 2022-01-02 ENCOUNTER — Ambulatory Visit (INDEPENDENT_AMBULATORY_CARE_PROVIDER_SITE_OTHER): Payer: Medicare Other | Admitting: Orthopaedic Surgery

## 2022-01-02 ENCOUNTER — Ambulatory Visit: Payer: Medicare Other | Admitting: Physical Therapy

## 2022-01-02 ENCOUNTER — Encounter: Payer: Self-pay | Admitting: Orthopaedic Surgery

## 2022-01-02 VITALS — BP 109/66 | HR 89 | Ht 63.5 in | Wt 142.0 lb

## 2022-01-02 DIAGNOSIS — Z96652 Presence of left artificial knee joint: Secondary | ICD-10-CM

## 2022-01-02 NOTE — Addendum Note (Signed)
Addended by: Barbette Or on: 01/02/2022 09:02 AM   Modules accepted: Orders
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
## 2022-01-03 NOTE — Discharge Summary (Signed)
Patient ID: Autumn Benjamin MRN: [id number] DOB/AGE: Mar 23, 1941 81 y.o.  Admit date: 12/18/2021 Discharge date: 12/22/2021  Admission Diagnoses:  Principal Problem:   Left knee DJD Active Problems:   S/P total knee arthroplasty, left   Discharge Diagnoses:  Principal Problem:   Left knee DJD Active Problems:   S/P total knee arthroplasty, left  status post Procedure(s): LEFT TOTAL KNEE ARTHROPLASTY  Past Medical History:  Diagnosis Date   Arthritis    RA   Dysrhythmia    HTN (hypertension)    Hypokalemia 06/04/2015   Hypothyroid    Irritable bowel syndrome (IBS)    Presence of permanent cardiac pacemaker    Severe protein-calorie malnutrition (HCC) 06/04/2015   Vitamin D deficiency     Surgeries: Procedure(s): LEFT TOTAL KNEE ARTHROPLASTY on 12/18/2021   Consultants:   Discharged Condition: Improved  Hospital Course: Autumn Benjamin is an 81 y.o. female who was admitted 12/18/2021 for operative treatment of Left knee DJD. Patient failed conservative treatments (please see the history and physical for the specifics) and had severe unremitting pain that affects sleep, daily activities and work/hobbies. After pre-op clearance, the patient was taken to the operating room on 12/18/2021 and underwent  Procedure(s): LEFT TOTAL KNEE ARTHROPLASTY.    Patient was given perioperative antibiotics:  Anti-infectives (From admission, onward)    Start     Dose/Rate Route Frequency Ordered Stop   12/18/21 1045  ceFAZolin (ANCEF) IVPB 2g/100 mL premix        2 g 200 mL/hr over 30 Minutes Intravenous On call to O.R. 12/18/21 1037 12/18/21 1237        Patient was given sequential compression devices and early ambulation to prevent DVT.   Patient benefited maximally from hospital stay and there were no complications. At the time of discharge, the patient was urinating/moving their bowels without difficulty, tolerating a regular diet, pain is controlled with oral pain medications  and they have been cleared by PT/OT.   Recent vital signs: No data found.   Recent laboratory studies: No results for input(s): "WBC", "HGB", "HCT", "PLT", "NA", "K", "CL", "CO2", "BUN", "CREATININE", "GLUCOSE", "INR", "CALCIUM" in the last 72 hours.  Invalid input(s): "PT", "2"   Discharge Medications:   Allergies as of 12/22/2021       Reactions   Latex Swelling   Pulls skin off   Codeine Diarrhea, Nausea And Vomiting   Methotrexate Derivatives    Caused hair loss   Orange Juice [orange Oil]         Medication List     TAKE these medications    alendronate 70 MG tablet Commonly known as: FOSAMAX Take 70 mg by mouth once a week. Take with a full glass of water on an empty stomach.   aspirin EC 325 MG tablet Take 1 tablet (325 mg total) by mouth daily with breakfast.   diclofenac 50 MG tablet Commonly known as: CATAFLAM Take 50 mg by mouth 2 (two) times daily.   loperamide 2 MG tablet Commonly known as: IMODIUM A-D Take 2 mg by mouth daily as needed for diarrhea or loose stools.   metoprolol tartrate 25 MG tablet Commonly known as: LOPRESSOR Take 25 mg by mouth 2 (two) times daily.   metoprolol tartrate 50 MG tablet Commonly known as: LOPRESSOR Take 1 tablet (50 mg total) by mouth 2 (two) times daily.   oxyCODONE-acetaminophen 5-325 MG tablet Commonly known as: Percocet Take 1 tablet by mouth every 4 (four) hours as needed for  severe pain.   Synthroid 100 MCG tablet Generic drug: levothyroxine Take 100 mcg by mouth daily before breakfast.   Vitamin D (Ergocalciferol) 1.25 MG (50000 UNIT) Caps capsule Commonly known as: DRISDOL Take 50,000 Units by mouth every 7 (seven) days.        Diagnostic Studies: XR Knee 1-2 Views Left  Result Date: 12/27/2021 Standing AP both knees lateral left knee demonstrates new left total knee arthroplasty with staples.  Right distal metaphyseal screws and intramedullary rod noted for supracondylar fracture unchanged.   Left total knee shows no loosening or subsidence. Impression: Satisfactory left total knee arthroplasty.      Follow-up Information     Iona Hansen, NP Follow up.   Specialty: Nurse Practitioner Contact information: [address] [place] [address] [phone]         Regan Lemming, MD .   Specialty: Cardiology Contact information: [address] [place] [address] [place] [address] [phone]         [facility] Follow up.   Why: home health services will be provided by [facility], start of care will be within 48 hours post discharge.        Eldred Manges, MD Follow up in 1 week(s).   Specialty: Orthopedic Surgery Contact information: [address] [place] [address] [phone]                 Discharge Plan:  discharge to home  Disposition:     Signed: Zonia Kief  01/03/2022, 3:15 PM

## 2022-01-04 ENCOUNTER — Telehealth: Payer: Self-pay | Admitting: Orthopaedic Surgery

## 2022-01-04 NOTE — Telephone Encounter (Signed)
Patient called advised she was taking an aspirin while she was in the hospital. Patient said she is now taking Ibuprofen. Patient asked if the Ibuprofen is the same as Aspirin and how many should he take?   The number to contact patient is [phone]

## 2022-01-04 NOTE — Telephone Encounter (Signed)
I lvm as well

## 2022-01-04 NOTE — Telephone Encounter (Signed)
Do you still want her on the aspirin?

## 2022-01-10 NOTE — Telephone Encounter (Signed)
Called and gave verbal ok 

## 2022-01-10 NOTE — Telephone Encounter (Signed)
Is this ok?

## 2022-01-10 NOTE — Telephone Encounter (Signed)
[facility] nurse Needs verbal orders to extend home therapy 2 times a week for 2 week and 1 time a week for 1 week please advise call [phone] please leave voicemail if no answer

## 2022-01-12 ENCOUNTER — Other Ambulatory Visit: Payer: Self-pay | Admitting: Physician Assistant

## 2022-01-12 ENCOUNTER — Telehealth: Payer: Self-pay | Admitting: Orthopaedic Surgery

## 2022-01-12 MED ORDER — OXYCODONE-ACETAMINOPHEN 5-325 MG PO TABS: 1.0000 | ORAL_TABLET | ORAL | 0 refills | Status: AC | PRN

## 2022-01-12 NOTE — Telephone Encounter (Signed)
Called pt and advised.  

## 2022-01-12 NOTE — Telephone Encounter (Signed)
Can you please refill since james and Dr. Ophelia Charter are out of office please

## 2022-01-12 NOTE — Telephone Encounter (Signed)
Patient called. Says she is in pain. Would like a refill on oxycodone called in. Her call back number is [phone]

## 2022-01-17 ENCOUNTER — Telehealth: Payer: Self-pay | Admitting: Orthopaedic Surgery

## 2022-01-17 NOTE — Telephone Encounter (Signed)
Pt called and phone accidental hung up. Tried to call pt back. Pt called asking if its ok for her to drive. Please call pt at [phone]

## 2022-01-17 NOTE — Telephone Encounter (Signed)
12/18/21 left total knee replacement. Pt is calling and asking if it is ok for her to drive.

## 2022-01-19 NOTE — Telephone Encounter (Signed)
I called and lm on vm to advise pt of message below. To call with any other questions.

## 2022-01-22 ENCOUNTER — Telehealth: Payer: Self-pay | Admitting: Orthopaedic Surgery

## 2022-01-22 NOTE — Telephone Encounter (Signed)
Lvm for pt to cb to advise 

## 2022-01-22 NOTE — Telephone Encounter (Signed)
Please advise 

## 2022-01-22 NOTE — Telephone Encounter (Signed)
Pt called asking for a return call.Pt would like to know if its ok for her to drive. Pt phone number is [phone]

## 2022-01-22 NOTE — Telephone Encounter (Signed)
Advised pt

## 2022-01-30 ENCOUNTER — Ambulatory Visit: Payer: Medicare Other | Attending: Orthopaedic Surgery | Admitting: Physical Therapy

## 2022-01-30 ENCOUNTER — Encounter: Payer: Self-pay | Admitting: Physical Therapy

## 2022-01-30 DIAGNOSIS — Z96652 Presence of left artificial knee joint: Secondary | ICD-10-CM | POA: Insufficient documentation

## 2022-01-30 DIAGNOSIS — M6281 Muscle weakness (generalized): Secondary | ICD-10-CM | POA: Diagnosis present

## 2022-01-30 DIAGNOSIS — R278 Other lack of coordination: Secondary | ICD-10-CM | POA: Insufficient documentation

## 2022-01-30 DIAGNOSIS — R293 Abnormal posture: Secondary | ICD-10-CM | POA: Insufficient documentation

## 2022-01-30 DIAGNOSIS — M25562 Pain in left knee: Secondary | ICD-10-CM | POA: Insufficient documentation

## 2022-01-30 DIAGNOSIS — R262 Difficulty in walking, not elsewhere classified: Secondary | ICD-10-CM | POA: Diagnosis present

## 2022-01-30 DIAGNOSIS — R2681 Unsteadiness on feet: Secondary | ICD-10-CM | POA: Insufficient documentation

## 2022-01-30 DIAGNOSIS — R600 Localized edema: Secondary | ICD-10-CM | POA: Diagnosis present
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
## 2022-02-01 ENCOUNTER — Encounter: Payer: Self-pay | Admitting: Physical Therapy

## 2022-02-01 ENCOUNTER — Ambulatory Visit: Payer: Medicare Other | Admitting: Physical Therapy

## 2022-02-01 DIAGNOSIS — M25562 Pain in left knee: Secondary | ICD-10-CM

## 2022-02-01 DIAGNOSIS — R262 Difficulty in walking, not elsewhere classified: Secondary | ICD-10-CM

## 2022-02-01 DIAGNOSIS — R600 Localized edema: Secondary | ICD-10-CM

## 2022-02-01 DIAGNOSIS — R293 Abnormal posture: Secondary | ICD-10-CM

## 2022-02-01 DIAGNOSIS — M6281 Muscle weakness (generalized): Secondary | ICD-10-CM

## 2022-02-01 DIAGNOSIS — R2681 Unsteadiness on feet: Secondary | ICD-10-CM

## 2022-02-01 DIAGNOSIS — R278 Other lack of coordination: Secondary | ICD-10-CM
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
## 2022-02-05 ENCOUNTER — Ambulatory Visit: Payer: Medicare Other | Admitting: Physical Therapy

## 2022-02-05 ENCOUNTER — Encounter: Payer: Self-pay | Admitting: Physical Therapy

## 2022-02-05 DIAGNOSIS — R293 Abnormal posture: Secondary | ICD-10-CM

## 2022-02-05 DIAGNOSIS — R600 Localized edema: Secondary | ICD-10-CM

## 2022-02-05 DIAGNOSIS — R262 Difficulty in walking, not elsewhere classified: Secondary | ICD-10-CM
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
## 2022-02-07 ENCOUNTER — Ambulatory Visit (INDEPENDENT_AMBULATORY_CARE_PROVIDER_SITE_OTHER): Payer: Medicare Other

## 2022-02-07 ENCOUNTER — Ambulatory Visit: Payer: Medicare Other | Admitting: Physical Therapy

## 2022-02-07 DIAGNOSIS — I495 Sick sinus syndrome: Secondary | ICD-10-CM | POA: Diagnosis not present

## 2022-02-07 LAB — CUP PACEART REMOTE DEVICE CHECK
Date Time Interrogation Session: 20230816072440
Implantable Lead Implant Date: 20161123
Implantable Lead Implant Date: 20161123
Implantable Lead Location: 753859
Implantable Lead Location: 753860
Implantable Lead Model: 5076
Implantable Lead Model: 5076
Implantable Pulse Generator Implant Date: 20161123
Pulse Gen Model: 394969
Pulse Gen Serial Number: 68664772

## 2022-02-09 ENCOUNTER — Encounter: Payer: Self-pay | Admitting: Physical Therapy

## 2022-02-09 ENCOUNTER — Ambulatory Visit: Payer: Medicare Other | Admitting: Physical Therapy

## 2022-02-09 DIAGNOSIS — R293 Abnormal posture: Secondary | ICD-10-CM

## 2022-02-09 DIAGNOSIS — R262 Difficulty in walking, not elsewhere classified: Secondary | ICD-10-CM

## 2022-02-09 DIAGNOSIS — R600 Localized edema: Secondary | ICD-10-CM
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
## 2022-02-13 ENCOUNTER — Ambulatory Visit: Payer: Medicare Other | Admitting: Physical Therapy

## 2022-02-13 ENCOUNTER — Encounter: Payer: Self-pay | Admitting: Physical Therapy

## 2022-02-13 DIAGNOSIS — R293 Abnormal posture: Secondary | ICD-10-CM | POA: Diagnosis not present

## 2022-02-13 DIAGNOSIS — R262 Difficulty in walking, not elsewhere classified: Secondary | ICD-10-CM

## 2022-02-13 DIAGNOSIS — R600 Localized edema: Secondary | ICD-10-CM

## 2022-02-13 DIAGNOSIS — R2681 Unsteadiness on feet: Secondary | ICD-10-CM

## 2022-02-13 DIAGNOSIS — M6281 Muscle weakness (generalized): Secondary | ICD-10-CM
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
## 2022-02-15 ENCOUNTER — Ambulatory Visit: Payer: Medicare Other | Admitting: Physical Therapy

## 2022-02-15 ENCOUNTER — Encounter: Payer: Self-pay | Admitting: Physical Therapy

## 2022-02-15 DIAGNOSIS — R600 Localized edema: Secondary | ICD-10-CM

## 2022-02-15 DIAGNOSIS — R262 Difficulty in walking, not elsewhere classified: Secondary | ICD-10-CM

## 2022-02-15 DIAGNOSIS — R293 Abnormal posture: Secondary | ICD-10-CM

## 2022-02-15 DIAGNOSIS — R2681 Unsteadiness on feet: Secondary | ICD-10-CM

## 2022-02-15 DIAGNOSIS — M6281 Muscle weakness (generalized): Secondary | ICD-10-CM

## 2022-02-15 DIAGNOSIS — R278 Other lack of coordination: Secondary | ICD-10-CM

## 2022-02-15 DIAGNOSIS — M25562 Pain in left knee: Secondary | ICD-10-CM
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
## 2022-02-20 ENCOUNTER — Encounter: Payer: Self-pay | Admitting: Physical Therapy

## 2022-02-20 ENCOUNTER — Ambulatory Visit: Payer: Medicare Other | Admitting: Physical Therapy

## 2022-02-20 DIAGNOSIS — R262 Difficulty in walking, not elsewhere classified: Secondary | ICD-10-CM

## 2022-02-20 DIAGNOSIS — M25562 Pain in left knee: Secondary | ICD-10-CM

## 2022-02-20 DIAGNOSIS — R600 Localized edema: Secondary | ICD-10-CM

## 2022-02-20 DIAGNOSIS — M6281 Muscle weakness (generalized): Secondary | ICD-10-CM

## 2022-02-20 DIAGNOSIS — R293 Abnormal posture: Secondary | ICD-10-CM | POA: Diagnosis not present
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
## 2022-02-22 ENCOUNTER — Encounter: Payer: Self-pay | Admitting: Physical Therapy

## 2022-02-22 ENCOUNTER — Ambulatory Visit: Payer: Medicare Other | Admitting: Physical Therapy

## 2022-02-22 DIAGNOSIS — R293 Abnormal posture: Secondary | ICD-10-CM | POA: Diagnosis not present

## 2022-02-22 DIAGNOSIS — R600 Localized edema: Secondary | ICD-10-CM

## 2022-02-22 DIAGNOSIS — R278 Other lack of coordination: Secondary | ICD-10-CM

## 2022-02-22 DIAGNOSIS — R262 Difficulty in walking, not elsewhere classified: Secondary | ICD-10-CM

## 2022-02-22 DIAGNOSIS — M25562 Pain in left knee: Secondary | ICD-10-CM

## 2022-02-22 DIAGNOSIS — R2681 Unsteadiness on feet: Secondary | ICD-10-CM

## 2022-02-22 DIAGNOSIS — M6281 Muscle weakness (generalized): Secondary | ICD-10-CM
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
## 2022-02-27 ENCOUNTER — Encounter: Payer: Self-pay | Admitting: Physical Therapy

## 2022-02-27 ENCOUNTER — Ambulatory Visit: Payer: Medicare Other | Attending: Orthopaedic Surgery | Admitting: Physical Therapy

## 2022-02-27 DIAGNOSIS — R293 Abnormal posture: Secondary | ICD-10-CM | POA: Insufficient documentation

## 2022-02-27 DIAGNOSIS — M25562 Pain in left knee: Secondary | ICD-10-CM | POA: Diagnosis present

## 2022-02-27 DIAGNOSIS — R600 Localized edema: Secondary | ICD-10-CM | POA: Diagnosis present

## 2022-02-27 DIAGNOSIS — R262 Difficulty in walking, not elsewhere classified: Secondary | ICD-10-CM | POA: Diagnosis present

## 2022-02-27 DIAGNOSIS — R2681 Unsteadiness on feet: Secondary | ICD-10-CM | POA: Diagnosis present

## 2022-02-27 DIAGNOSIS — R278 Other lack of coordination: Secondary | ICD-10-CM | POA: Diagnosis present

## 2022-02-27 DIAGNOSIS — M6281 Muscle weakness (generalized): Secondary | ICD-10-CM | POA: Insufficient documentation
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
## 2022-03-01 ENCOUNTER — Ambulatory Visit: Payer: Medicare Other | Admitting: Physical Therapy

## 2022-03-01 ENCOUNTER — Encounter: Payer: Self-pay | Admitting: Physical Therapy

## 2022-03-01 DIAGNOSIS — R600 Localized edema: Secondary | ICD-10-CM

## 2022-03-01 DIAGNOSIS — R262 Difficulty in walking, not elsewhere classified: Secondary | ICD-10-CM

## 2022-03-01 DIAGNOSIS — R293 Abnormal posture: Secondary | ICD-10-CM

## 2022-03-01 DIAGNOSIS — M6281 Muscle weakness (generalized): Secondary | ICD-10-CM

## 2022-03-01 DIAGNOSIS — R2681 Unsteadiness on feet: Secondary | ICD-10-CM
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
## 2022-03-08 NOTE — Progress Notes (Signed)
Remote pacemaker transmission.   

## 2022-05-02 ENCOUNTER — Encounter: Payer: Self-pay | Admitting: Cardiology

## 2022-05-02 ENCOUNTER — Ambulatory Visit: Payer: Medicare Other | Attending: Cardiology | Admitting: Cardiology

## 2022-05-02 VITALS — BP 136/80 | HR 73 | Ht 63.5 in | Wt 135.8 lb

## 2022-05-02 DIAGNOSIS — I442 Atrioventricular block, complete: Secondary | ICD-10-CM | POA: Diagnosis not present

## 2022-05-02 DIAGNOSIS — I471 Supraventricular tachycardia, unspecified: Secondary | ICD-10-CM | POA: Diagnosis not present

## 2022-05-02 LAB — CUP PACEART INCLINIC DEVICE CHECK
Battery Remaining Longevity: 60 mo
Brady Statistic RA Percent Paced: 56 %
Brady Statistic RV Percent Paced: 1 %
Date Time Interrogation Session: 20231108095436
Implantable Lead Connection Status: 753985
Implantable Lead Connection Status: 753985
Implantable Lead Implant Date: 20161123
Implantable Lead Implant Date: 20161123
Implantable Lead Location: 753859
Implantable Lead Location: 753860
Implantable Lead Model: 5076
Implantable Lead Model: 5076
Implantable Pulse Generator Implant Date: 20161123
Lead Channel Impedance Value: 370 Ohm
Lead Channel Impedance Value: 468 Ohm
Lead Channel Pacing Threshold Amplitude: 0.5 V
Lead Channel Pacing Threshold Amplitude: 1.3 V
Lead Channel Pacing Threshold Pulse Width: 0.4 ms
Lead Channel Pacing Threshold Pulse Width: 0.4 ms
Lead Channel Sensing Intrinsic Amplitude: 13.8 mV
Lead Channel Sensing Intrinsic Amplitude: 2.6 mV
Pulse Gen Model: 394969
Pulse Gen Serial Number: 68664772
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
## 2022-05-09 ENCOUNTER — Ambulatory Visit (INDEPENDENT_AMBULATORY_CARE_PROVIDER_SITE_OTHER): Payer: Medicare Other

## 2022-05-09 DIAGNOSIS — I442 Atrioventricular block, complete: Secondary | ICD-10-CM

## 2022-05-09 DIAGNOSIS — Z950 Presence of cardiac pacemaker: Secondary | ICD-10-CM | POA: Diagnosis not present

## 2022-05-09 LAB — CUP PACEART REMOTE DEVICE CHECK
Date Time Interrogation Session: 20231115081025
Implantable Lead Connection Status: 753985
Implantable Lead Connection Status: 753985
Implantable Lead Implant Date: 20161123
Implantable Lead Implant Date: 20161123
Implantable Lead Location: 753859
Implantable Lead Location: 753860
Implantable Lead Model: 5076
Implantable Lead Model: 5076
Implantable Pulse Generator Implant Date: 20161123
Pulse Gen Model: 394969
Pulse Gen Serial Number: 68664772

## 2022-06-01 NOTE — Progress Notes (Signed)
Remote pacemaker transmission.   

## 2022-08-08 ENCOUNTER — Ambulatory Visit: Payer: Medicare Other

## 2022-08-08 DIAGNOSIS — I442 Atrioventricular block, complete: Secondary | ICD-10-CM

## 2022-08-08 LAB — CUP PACEART REMOTE DEVICE CHECK
Battery Voltage: 50
Date Time Interrogation Session: 20240214072904
Implantable Lead Connection Status: 753985
Implantable Lead Connection Status: 753985
Implantable Lead Implant Date: 20161123
Implantable Lead Implant Date: 20161123
Implantable Lead Location: 753859
Implantable Lead Location: 753860
Implantable Lead Model: 5076
Implantable Lead Model: 5076
Implantable Pulse Generator Implant Date: 20161123
Pulse Gen Model: 394969
Pulse Gen Serial Number: 68664772

## 2022-09-07 NOTE — Progress Notes (Signed)
Remote pacemaker transmission.   

## 2022-10-24 ENCOUNTER — Emergency Department (HOSPITAL_BASED_OUTPATIENT_CLINIC_OR_DEPARTMENT_OTHER): Payer: Medicare Other

## 2022-10-24 ENCOUNTER — Other Ambulatory Visit: Payer: Self-pay

## 2022-10-24 ENCOUNTER — Emergency Department (HOSPITAL_BASED_OUTPATIENT_CLINIC_OR_DEPARTMENT_OTHER)
Admission: EM | Admit: 2022-10-24 | Discharge: 2022-10-24 | Disposition: A | Discharge status: Home / Self Care | Payer: Medicare Other | Attending: Emergency Medicine | Admitting: Emergency Medicine

## 2022-10-24 DIAGNOSIS — R110 Nausea: Secondary | ICD-10-CM | POA: Insufficient documentation

## 2022-10-24 DIAGNOSIS — Z950 Presence of cardiac pacemaker: Secondary | ICD-10-CM | POA: Insufficient documentation

## 2022-10-24 DIAGNOSIS — I10 Essential (primary) hypertension: Secondary | ICD-10-CM | POA: Diagnosis not present

## 2022-10-24 DIAGNOSIS — Z1152 Encounter for screening for COVID-19: Secondary | ICD-10-CM | POA: Diagnosis not present

## 2022-10-24 DIAGNOSIS — R0981 Nasal congestion: Secondary | ICD-10-CM | POA: Insufficient documentation

## 2022-10-24 DIAGNOSIS — Z91040 Latex allergy status: Secondary | ICD-10-CM | POA: Insufficient documentation

## 2022-10-24 DIAGNOSIS — Z79899 Other long term (current) drug therapy: Secondary | ICD-10-CM | POA: Diagnosis not present

## 2022-10-24 DIAGNOSIS — R030 Elevated blood-pressure reading, without diagnosis of hypertension: Secondary | ICD-10-CM

## 2022-10-24 DIAGNOSIS — R519 Headache, unspecified: Secondary | ICD-10-CM | POA: Insufficient documentation

## 2022-10-24 DIAGNOSIS — Z7982 Long term (current) use of aspirin: Secondary | ICD-10-CM | POA: Diagnosis not present

## 2022-10-24 LAB — CBC WITH DIFFERENTIAL/PLATELET
Abs Immature Granulocytes: 0.02 10*3/uL (ref 0.00–0.07)
Basophils Absolute: 0 10*3/uL (ref 0.0–0.1)
Basophils Relative: 0 %
Eosinophils Absolute: 0.1 10*3/uL (ref 0.0–0.5)
Eosinophils Relative: 2 %
HCT: 36.7 % (ref 36.0–46.0)
Hemoglobin: 11.9 g/dL — ABNORMAL LOW (ref 12.0–15.0)
Immature Granulocytes: 1 %
Lymphocytes Relative: 12 %
Lymphs Abs: 0.5 10*3/uL — ABNORMAL LOW (ref 0.7–4.0)
MCH: 27.9 pg (ref 26.0–34.0)
MCHC: 32.4 g/dL (ref 30.0–36.0)
MCV: 85.9 fL (ref 80.0–100.0)
Monocytes Absolute: 0.6 10*3/uL (ref 0.1–1.0)
Monocytes Relative: 14 %
Neutro Abs: 2.9 10*3/uL (ref 1.7–7.7)
Neutrophils Relative %: 71 %
Platelets: 135 10*3/uL — ABNORMAL LOW (ref 150–400)
RBC: 4.27 MIL/uL (ref 3.87–5.11)
RDW: 13.2 % (ref 11.5–15.5)
WBC: 4 10*3/uL (ref 4.0–10.5)
nRBC: 0 % (ref 0.0–0.2)

## 2022-10-24 LAB — LIPASE, BLOOD: Lipase: 27 U/L (ref 11–51)

## 2022-10-24 LAB — HEPATIC FUNCTION PANEL
ALT: 12 U/L (ref 0–44)
AST: 25 U/L (ref 15–41)
Albumin: 4.2 g/dL (ref 3.5–5.0)
Alkaline Phosphatase: 77 U/L (ref 38–126)
Bilirubin, Direct: 0.1 mg/dL (ref 0.0–0.2)
Indirect Bilirubin: 0.5 mg/dL (ref 0.3–0.9)
Total Bilirubin: 0.6 mg/dL (ref 0.3–1.2)
Total Protein: 7.4 g/dL (ref 6.5–8.1)

## 2022-10-24 LAB — BASIC METABOLIC PANEL
Anion gap: 9 (ref 5–15)
BUN: 16 mg/dL (ref 8–23)
CO2: 25 mmol/L (ref 22–32)
Calcium: 8.9 mg/dL (ref 8.9–10.3)
Chloride: 103 mmol/L (ref 98–111)
Creatinine, Ser: 0.73 mg/dL (ref 0.44–1.00)
GFR, Estimated: >60 mL/min (ref >60)
Glucose, Bld: 125 mg/dL — ABNORMAL HIGH (ref 70–99)
Potassium: 3.9 mmol/L (ref 3.5–5.1)
Sodium: 137 mmol/L (ref 135–145)

## 2022-10-24 LAB — SARS CORONAVIRUS 2 BY RT PCR: SARS Coronavirus 2 by RT PCR: NEGATIVE

## 2022-10-24 MED ORDER — DIPHENHYDRAMINE HCL 50 MG/ML IJ SOLN
25.0000 mg | Freq: Once | INTRAMUSCULAR | Status: AC
  Administered 2022-10-24: 25 mg via INTRAVENOUS
  Filled 2022-10-24: qty 1

## 2022-10-24 MED ORDER — ACETAMINOPHEN 325 MG PO TABS
650.0000 mg | ORAL_TABLET | Freq: Once | ORAL | Status: AC
  Administered 2022-10-24: 650 mg via ORAL
  Filled 2022-10-24: qty 2

## 2022-10-24 MED ORDER — PROCHLORPERAZINE EDISYLATE 10 MG/2ML IJ SOLN
5.0000 mg | Freq: Once | INTRAMUSCULAR | Status: AC
  Administered 2022-10-24: 5 mg via INTRAVENOUS
  Filled 2022-10-24: qty 2

## 2022-10-24 NOTE — Discharge Instructions (Signed)
Your history, exam, and workup today was overall reassuring.  Her blood pressure improved after the headache medications and your CT head showed no evidence of abnormality or bleeding today.  Your labs were overall reassuring and we feel you are safe for discharge home.  Please rest and stay hydrated and take your home medications.  Please follow-up with your regular doctor to discuss blood pressure medication titration.  If any symptoms change or worsen today, please return to the nearest emergency department.
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
## 2022-10-24 NOTE — ED Triage Notes (Signed)
Patient presents to ED via POV from home with son. Here with headache. Son expresses concern for hypertension. Also reports nasal congestion and her stomach feeling unsettled.

## 2022-10-25 ENCOUNTER — Telehealth: Payer: Self-pay | Admitting: Cardiology

## 2022-10-25 NOTE — Telephone Encounter (Signed)
Patient is returning call. Please advise? 

## 2022-10-25 NOTE — Telephone Encounter (Signed)
Patient called stating she went to the [facility] yesterday, she wanted to know if Dr. Girard Cooter would review her records from yesterday.

## 2022-10-25 NOTE — Telephone Encounter (Signed)
Pt c/o Syncope: STAT if syncope occurred within 30 minutes and pt complains of lightheadedness [place] if episode of passing out, completely, today or in last 24 hours   Did you pass out today?   No   When is the last time you passed out?    N/A  Has this occurred multiple times?  No  Did you have any symptoms prior to passing out?   N/A  Patient stated she feels like she is "moving in slow motion'.  Patient stated yesterday when she woke up she just didn't "feel right".  Patient is concerned this may be related to her pace maker.

## 2022-10-25 NOTE — Telephone Encounter (Signed)
Left message for patient to call back  

## 2022-10-26 NOTE — Telephone Encounter (Signed)
Reviewed updated Biotronik transmission. Normal device function, no episodes, nothing to correlate with symptom event.  Discussed with patient.  Patient reports on 5/1 she woke up feeling pain up the back of her head and severe headache.  Went to ER.  Noted elevated blood pressure. No other symptoms that she can recall.  She is better now but states she still is not sleeping well.  I told her to keep a record of her blood pressures and follow up with her PCP for next steps as nothing correlates with her device.  Patient thanks me for the call and follow up. She thought maybe something with her device was making her head hurt.

## 2022-10-26 NOTE — Telephone Encounter (Signed)
Pt advised of MD recommendation, and is agreeable to plan.

## 2022-11-07 ENCOUNTER — Ambulatory Visit (INDEPENDENT_AMBULATORY_CARE_PROVIDER_SITE_OTHER): Payer: Medicare Other

## 2022-11-07 DIAGNOSIS — I442 Atrioventricular block, complete: Secondary | ICD-10-CM | POA: Diagnosis not present

## 2022-11-07 LAB — CUP PACEART REMOTE DEVICE CHECK
Battery Voltage: 50
Date Time Interrogation Session: 20240515064903
Implantable Lead Connection Status: 753985
Implantable Lead Connection Status: 753985
Implantable Lead Implant Date: 20161123
Implantable Lead Implant Date: 20161123
Implantable Lead Location: 753859
Implantable Lead Location: 753860
Implantable Lead Model: 5076
Implantable Lead Model: 5076
Implantable Pulse Generator Implant Date: 20161123
Pulse Gen Model: 394969
Pulse Gen Serial Number: 68664772

## 2022-11-22 NOTE — Progress Notes (Signed)
Remote pacemaker transmission.   

## 2022-12-03 ENCOUNTER — Emergency Department (HOSPITAL_COMMUNITY)
Admission: EM | Admit: 2022-12-03 | Discharge: 2022-12-04 | Disposition: A | Discharge status: Home / Self Care | Payer: Medicare Other | Attending: Emergency Medicine | Admitting: Emergency Medicine

## 2022-12-03 ENCOUNTER — Emergency Department (HOSPITAL_COMMUNITY): Payer: Medicare Other

## 2022-12-03 ENCOUNTER — Other Ambulatory Visit: Payer: Self-pay

## 2022-12-03 ENCOUNTER — Encounter (HOSPITAL_COMMUNITY): Payer: Self-pay | Admitting: *Deleted

## 2022-12-03 DIAGNOSIS — Z79899 Other long term (current) drug therapy: Secondary | ICD-10-CM | POA: Insufficient documentation

## 2022-12-03 DIAGNOSIS — Z91040 Latex allergy status: Secondary | ICD-10-CM | POA: Diagnosis not present

## 2022-12-03 DIAGNOSIS — R519 Headache, unspecified: Secondary | ICD-10-CM

## 2022-12-03 DIAGNOSIS — I10 Essential (primary) hypertension: Secondary | ICD-10-CM

## 2022-12-03 DIAGNOSIS — R42 Dizziness and giddiness: Secondary | ICD-10-CM | POA: Diagnosis present

## 2022-12-03 DIAGNOSIS — Z1152 Encounter for screening for COVID-19: Secondary | ICD-10-CM | POA: Diagnosis not present

## 2022-12-03 DIAGNOSIS — Z7982 Long term (current) use of aspirin: Secondary | ICD-10-CM | POA: Diagnosis not present

## 2022-12-03 LAB — CBC WITH DIFFERENTIAL/PLATELET
Abs Immature Granulocytes: 0.01 10*3/uL (ref 0.00–0.07)
Basophils Absolute: 0 10*3/uL (ref 0.0–0.1)
Basophils Relative: 0 %
Eosinophils Absolute: 0.1 10*3/uL (ref 0.0–0.5)
Eosinophils Relative: 1 %
HCT: 33.9 % — ABNORMAL LOW (ref 36.0–46.0)
Hemoglobin: 11 g/dL — ABNORMAL LOW (ref 12.0–15.0)
Immature Granulocytes: 0 %
Lymphocytes Relative: 10 %
Lymphs Abs: 0.5 10*3/uL — ABNORMAL LOW (ref 0.7–4.0)
MCH: 28.4 pg (ref 26.0–34.0)
MCHC: 32.4 g/dL (ref 30.0–36.0)
MCV: 87.4 fL (ref 80.0–100.0)
Monocytes Absolute: 0.3 10*3/uL (ref 0.1–1.0)
Monocytes Relative: 8 %
Neutro Abs: 3.7 10*3/uL (ref 1.7–7.7)
Neutrophils Relative %: 81 %
Platelets: 119 10*3/uL — ABNORMAL LOW (ref 150–400)
RBC: 3.88 MIL/uL (ref 3.87–5.11)
RDW: 13.2 % (ref 11.5–15.5)
WBC: 4.5 10*3/uL (ref 4.0–10.5)
nRBC: 0 % (ref 0.0–0.2)

## 2022-12-03 LAB — TROPONIN I (HIGH SENSITIVITY): Troponin I (High Sensitivity): 8 ng/L (ref <18)

## 2022-12-03 LAB — COMPREHENSIVE METABOLIC PANEL
ALT: 10 U/L (ref 0–44)
AST: 20 U/L (ref 15–41)
Albumin: 3.6 g/dL (ref 3.5–5.0)
Alkaline Phosphatase: 67 U/L (ref 38–126)
Anion gap: 9 (ref 5–15)
BUN: 15 mg/dL (ref 8–23)
CO2: 25 mmol/L (ref 22–32)
Calcium: 8.7 mg/dL — ABNORMAL LOW (ref 8.9–10.3)
Chloride: 104 mmol/L (ref 98–111)
Creatinine, Ser: 0.71 mg/dL (ref 0.44–1.00)
GFR, Estimated: >60 mL/min (ref >60)
Glucose, Bld: 127 mg/dL — ABNORMAL HIGH (ref 70–99)
Potassium: 3.8 mmol/L (ref 3.5–5.1)
Sodium: 138 mmol/L (ref 135–145)
Total Bilirubin: 1.1 mg/dL (ref 0.3–1.2)
Total Protein: 6.2 g/dL — ABNORMAL LOW (ref 6.5–8.1)

## 2022-12-03 LAB — SARS CORONAVIRUS 2 BY RT PCR: SARS Coronavirus 2 by RT PCR: NEGATIVE

## 2022-12-03 MED ORDER — SODIUM CHLORIDE 0.9 % IV BOLUS
500.0000 mL | Freq: Once | INTRAVENOUS | Status: AC
  Administered 2022-12-03: 500 mL via INTRAVENOUS

## 2022-12-03 MED ORDER — METOCLOPRAMIDE HCL 10 MG PO TABS
5.0000 mg | ORAL_TABLET | Freq: Once | ORAL | Status: AC
  Administered 2022-12-03: 5 mg via ORAL
  Filled 2022-12-03: qty 1

## 2022-12-03 NOTE — ED Notes (Signed)
This paramedic attempted to interrogate patient pacemaker with medtronic and the machine states pacemaker not found. RN then attempted with same outcome. Attempted with [place] [employer] interrogator with same outcome.

## 2022-12-03 NOTE — ED Notes (Signed)
PT ambulated to restroom without assistance.

## 2022-12-03 NOTE — ED Notes (Addendum)
Pt ambulated to restroom with assistance. Ambulated back to room without assistance. Steady gait/  PT transported to CT

## 2022-12-03 NOTE — Discharge Instructions (Addendum)
You were seen in the emergency department for dizziness and headache.  Your labs and imaging were all reassuring today and your blood pressure improved with treatment of the headache.  I am unsure what exactly is causing the surges in her blood pressure given that you are taking her blood pressure medication as prescribed so I would encourage that you follow-up with your cardiologist for further evaluation of this.  If you have any recurrence of the symptoms, please return to the emergency department for further evaluation and management.

## 2022-12-03 NOTE — ED Provider Notes (Signed)
[place] EMERGENC[facility] DEPARTMENT AT [facility] Provider Note   CSN: [id number] Arrival date & time: 12/03/22  4098     History Chief Complaint  Patient presents with   Dizziness    hea   Headache    Autumn Benjamin is a 82 y.o. female.  Patient presents to the emergency department complaints of dizziness and headache.  She states that this has been ongoing for the last 12 hours or some began last night.  Reports some associated nausea but no active vomiting.  Denies any diarrhea.  Patient was diagnosed with a sinus infection approximately 5 weeks ago and was prescribed amoxicillin which she reports did improve her symptoms.  Does not believe that this is a recurrence of her sinus infection as it does not feel the same.  Patient was also seen for a headache on May 1 here in the emergency department.  She denies using any blood thinners.  No prior history significant for migraine headaches.   Dizziness Associated symptoms: headaches   Headache Associated symptoms: dizziness        Home Medications Prior to Admission medications   Medication Sig Start Date End Date Taking? Authorizing Provider  alendronate (FOSAMAX) 70 MG tablet Take 70 mg by mouth once a week. Take with a full glass of water on an empty stomach.    [provider]  aspirin EC 325 MG tablet Take 1 tablet (325 mg total) by mouth daily with breakfast. 12/22/21   Eldred Manges, MD  diclofenac (CATAFLAM) 50 MG tablet Take 50 mg by mouth 2 (two) times daily. 10/04/21   [provider]  loperamide (IMODIUM A-D) 2 MG tablet Take 2 mg by mouth daily as needed for diarrhea or loose stools.    [provider]  metoprolol tartrate (LOPRESSOR) 50 MG tablet Take 1 tablet (50 mg total) by mouth 2 (two) times daily. 10/27/21   Sheilah Pigeon, PA-C  oxyCODONE-acetaminophen (PERCOCET) 5-325 MG tablet Take 1 tablet by mouth every 4 (four) hours as needed for severe pain. 01/12/22 01/12/23  Persons,  [place], PA  SYNTHROID 100 MCG tablet Take 100 mcg by mouth daily before breakfast. 04/20/15   [provider]  Vitamin D, Ergocalciferol, (DRISDOL) 1.25 MG (50000 UNIT) CAPS capsule Take 50,000 Units by mouth every 7 (seven) days.    [provider]      Allergies    Latex, Codeine, Methotrexate derivatives, and Orange juice [orange oil]    Review of Systems   Review of Systems  Neurological:  Positive for dizziness and headaches.  All other systems reviewed and are negative.   Physical Exam Updated Vital Signs BP 131/61   Pulse 67   Temp 97.7 F (36.5 C)   Resp 15   Ht 5\' 7"  (1.702 m)   Wt 61.2 kg   SpO2 100%   BMI 21.14 kg/m  Physical Exam Vitals and nursing note reviewed.  Constitutional:      General: She is not in acute distress.    Appearance: She is well-developed.  HENT:     Head: Normocephalic and atraumatic.  Eyes:     Conjunctiva/sclera: Conjunctivae normal.  Cardiovascular:     Rate and Rhythm: Normal rate and regular rhythm.     Heart sounds: No murmur heard. Pulmonary:     Effort: Pulmonary effort is normal. No respiratory distress.     Breath sounds: Normal breath sounds.  Abdominal:     Palpations: Abdomen is soft.  Tenderness: There is no abdominal tenderness.  Musculoskeletal:        General: No swelling.     Cervical back: Neck supple.  Skin:    General: Skin is warm and dry.     Capillary Refill: Capillary refill takes less than 2 seconds.  Neurological:     Mental Status: She is alert.     Cranial Nerves: No cranial nerve deficit or facial asymmetry.     Comments: CN III-XII grossly intact. No acute weakness or numbness when comparing one side to other in upper or lower extremities. Face symmetric, no facial droop, no slurred speech, and not acute altered.  Psychiatric:        Mood and Affect: Mood normal.     ED Results / Procedures / Treatments   Labs (all labs ordered are listed, but only abnormal results are  displayed) Labs Reviewed  CBC WITH DIFFERENTIAL/PLATELET - Abnormal; Notable for the following components:      Result Value   Hemoglobin 11.0 (*)    HCT 33.9 (*)    Platelets 119 (*)    Lymphs Abs 0.5 (*)    All other components within normal limits  COMPREHENSIVE METABOLIC PANEL - Abnormal; Notable for the following components:   Glucose, Bld 127 (*)    Calcium 8.7 (*)    Total Protein 6.2 (*)    All other components within normal limits  SARS CORONAVIRUS 2 BY RT PCR  TROPONIN I (HIGH SENSITIVITY)    EKG EKG Interpretation  Date/Time:  Monday December 03 2022 07:10:42 EDT Ventricular Rate:  59 PR Interval:  254 QRS Duration: 85 QT Interval:  434 QTC Calculation: 430 R Axis:   74 Text Interpretation: Atrial paced rhythm with occasional native sinus beats Prolonged PR interval Minimal ST depression, anterolateral leads  Confirmed by Vonita Moss [phone]) on 12/03/2022 9:41:03 AM  Radiology No results found.  Procedures Procedures   Medications Ordered in ED Medications  metoCLOPramide (REGLAN) tablet 5 mg (5 mg Oral Given 12/03/22 0753)  sodium chloride 0.9 % bolus 500 mL (0 mLs Intravenous Stopped 12/03/22 1141)    ED Course/ Medical Decision Making/ A&P Clinical Course as of 12/05/22 2306  Mon Dec 03, 2022  0712 EKG 12-Lead [OZ]    Clinical Course User Index [OZ] Smitty Knudsen, PA-C                           Medical Decision Making Amount and/or Complexity of Data Reviewed Labs: ordered. Radiology: ordered. ECG/medicine tests:  Decision-making details documented in ED Course.  Risk Prescription drug management.   This patient presents to the ED for concern of dizziness, headache.  Differential diagnosis includes stroke, BPPV, dehydration, hypertensive emergency   Lab Tests:  I Ordered, and personally interpreted labs.  The pertinent results include: CBC unremarkable, CMP unremarkable, COVID-19 negative, troponin negative   Imaging Studies  ordered:  I ordered imaging studies including CT head I independently visualized and interpreted imaging which showed no acute abnormality I agree with the radiologist interpretation   Medicines ordered and prescription drug management:  I ordered medication including Reglan, fluids for nausea Reevaluation of the patient after these medicines showed that the patient improved I have reviewed the patients home medicines and have made adjustments as needed   Problem List / ED Course:  Patient presented to the emergency department complaints of headache and dizziness.  She reports has been present for several hours prior to  arriving emergency department and occurred when she woke up.  Has previously had similar episodes occur.  Patient's and reports that this has happened in the past when patient's blood pressure has been notably elevated at home.  She reports she has been taking her blood pressure medications as prescribed not missing any doses.  Denies any chest pain or shortness of breath or any cardiac complaints at this time.  Low concern for ACS at this time.  Will evaluate patient with basic labs including troponin as well as CT imaging. Labs largely unremarkable without any acute abnormalities noted.  No significant change in patient's hemoglobin level.  CT imaging negative for any acute intracranial normality.  Patient blood pressure has normalized now with administration of antiemetic and fluid medications.  No need for antihypertensive medication.  Currently patient is stable for outpatient follow-up with cardiology discharge home at this time.  Patient and family agreeable with treatment plan and verbalized understanding all return precautions.  Final Clinical Impression(s) / ED Diagnoses Final diagnoses:  Dizziness  Bad headache  Hypertension, unspecified type    Rx / DC Orders ED Discharge Orders          Ordered    Ambulatory referral to Cardiology       Comments: If you  have not heard from the Cardiology office within the next 72 hours please call [phone].   12/03/22 1403              Smitty Knudsen, PA-C 12/05/22 2306    Rondel Baton, MD 12/06/22 1217

## 2022-12-03 NOTE — ED Triage Notes (Signed)
Patient presents to ed via [facility] states she went to bed  last pm around 10 c/o nasal congestion, with nausea and headache. States she just tossed and turned all night. Upon ems arrival patient was c/o dizziness and dry heaves. Patient is alert oriented.

## 2022-12-04 ENCOUNTER — Telehealth: Payer: Self-pay | Admitting: Cardiology

## 2022-12-04 NOTE — Telephone Encounter (Signed)
Patient was recently in the hospital and was told to f/u with the dr. Patient on want to see dr Elberta Fortis advise there nothing till sept. Patient begin to get upset, advise I will send a message to the nurse. Please advise

## 2022-12-04 NOTE — Telephone Encounter (Signed)
Talked w/ pt as well, While 2 minutes into conversation is when I noticed device RN had already spoken with pt. Pt aware PPM is working fine. (She did not complain of any pain at the site).  She appreciates office follow up calls

## 2022-12-04 NOTE — Telephone Encounter (Signed)
Patient states she went to the ED yesterday. She had a bad headache and could not sleep. She states she also was having pain in her chest where her device is located. She states she is better today but was told by ED to follow up with cardiologist.

## 2022-12-04 NOTE — Telephone Encounter (Signed)
Patient reports of having pain at her pacemaker site that started 12/02/22. Went to the ER 12/05/22 for headache/dizziness/pain at ppm site. Patient states she has taken tylenol for pain with no relief. Patient denies any redness, swelling, drainage, fever or chills. Advised patient to use ice to the area to help with pain. Advised also to follow up with her pcp for other symptoms. Patient voiced understanding.

## 2023-02-06 ENCOUNTER — Ambulatory Visit (INDEPENDENT_AMBULATORY_CARE_PROVIDER_SITE_OTHER): Payer: Medicare Other

## 2023-02-06 DIAGNOSIS — I442 Atrioventricular block, complete: Secondary | ICD-10-CM | POA: Diagnosis not present

## 2023-02-06 LAB — CUP PACEART REMOTE DEVICE CHECK
Battery Voltage: 50
Date Time Interrogation Session: 20240814092921
Implantable Lead Connection Status: 753985
Implantable Lead Connection Status: 753985
Implantable Lead Implant Date: 20161123
Implantable Lead Implant Date: 20161123
Implantable Lead Location: 753859
Implantable Lead Location: 753860
Implantable Lead Model: 5076
Implantable Lead Model: 5076
Implantable Pulse Generator Implant Date: 20161123
Pulse Gen Model: 394969
Pulse Gen Serial Number: 68664772

## 2023-02-20 NOTE — Progress Notes (Signed)
Remote pacemaker transmission.   

## 2023-03-01 ENCOUNTER — Ambulatory Visit: Payer: Medicare Other | Admitting: Cardiology
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
## 2023-03-14 ENCOUNTER — Ambulatory Visit: Payer: Medicare Other | Attending: Cardiology | Admitting: Student

## 2023-03-14 DIAGNOSIS — Z950 Presence of cardiac pacemaker: Secondary | ICD-10-CM

## 2023-03-14 DIAGNOSIS — I10 Essential (primary) hypertension: Secondary | ICD-10-CM

## 2023-03-14 DIAGNOSIS — I471 Supraventricular tachycardia, unspecified: Secondary | ICD-10-CM

## 2023-03-14 DIAGNOSIS — I442 Atrioventricular block, complete: Secondary | ICD-10-CM

## 2023-03-15 ENCOUNTER — Encounter: Payer: Self-pay | Admitting: Student

## 2023-03-21 ENCOUNTER — Encounter: Payer: Self-pay | Admitting: Cardiology

## 2023-04-11 ENCOUNTER — Encounter: Payer: Self-pay | Admitting: General Practice

## 2023-04-11 ENCOUNTER — Telehealth: Payer: Self-pay | Admitting: *Deleted

## 2023-04-11 NOTE — Telephone Encounter (Signed)
-----   Message from [place] [facility] sent at 04/11/2023 10:37 AM EDT ----- Regarding: RE: due for follow up with EP APP Several attempts to reach pt to schedule an appt. Unable to reach pt. Certified letter mailed.   Cassie ----- Message ----- From: Wiliam Ke, RN Sent: 04/01/2023   8:13 AM EDT To: Cvd-Ep Scheduling Subject: due for follow up with EP APP

## 2023-05-01 ENCOUNTER — Telehealth: Payer: Self-pay

## 2023-05-01 NOTE — Telephone Encounter (Signed)
This patient is due this month for routine appt with Dr. Elberta Fortis.  Can you get her scheduled in the upcoming weeks with either PA or [place] for yearly follow up?  Thanks.

## 2023-05-08 ENCOUNTER — Ambulatory Visit: Payer: Medicare Other

## 2023-05-08 DIAGNOSIS — I442 Atrioventricular block, complete: Secondary | ICD-10-CM

## 2023-05-09 LAB — CUP PACEART REMOTE DEVICE CHECK
Date Time Interrogation Session: 20241113101543
Implantable Lead Connection Status: 753985
Implantable Lead Connection Status: 753985
Implantable Lead Implant Date: 20161123
Implantable Lead Implant Date: 20161123
Implantable Lead Location: 753859
Implantable Lead Location: 753860
Implantable Lead Model: 5076
Implantable Lead Model: 5076
Implantable Pulse Generator Implant Date: 20161123
Pulse Gen Model: 394969
Pulse Gen Serial Number: 68664772
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
## 2023-05-16 ENCOUNTER — Ambulatory Visit: Payer: Medicare Other | Attending: Pulmonary Disease | Admitting: Pulmonary Disease

## 2023-05-16 ENCOUNTER — Encounter: Payer: Self-pay | Admitting: Pulmonary Disease

## 2023-05-16 VITALS — BP 160/84 | HR 84 | Ht 67.0 in | Wt 132.4 lb

## 2023-05-16 DIAGNOSIS — Z950 Presence of cardiac pacemaker: Secondary | ICD-10-CM

## 2023-05-16 DIAGNOSIS — I442 Atrioventricular block, complete: Secondary | ICD-10-CM

## 2023-05-16 DIAGNOSIS — I471 Supraventricular tachycardia, unspecified: Secondary | ICD-10-CM | POA: Diagnosis not present

## 2023-05-16 DIAGNOSIS — I10 Essential (primary) hypertension: Secondary | ICD-10-CM

## 2023-05-16 LAB — CUP PACEART INCLINIC DEVICE CHECK
Date Time Interrogation Session: 20241121100632
Implantable Lead Connection Status: 753985
Implantable Lead Connection Status: 753985
Implantable Lead Implant Date: 20161123
Implantable Lead Implant Date: 20161123
Implantable Lead Location: 753859
Implantable Lead Location: 753860
Implantable Lead Model: 5076
Implantable Lead Model: 5076
Implantable Pulse Generator Implant Date: 20161123
Pulse Gen Model: 394969
Pulse Gen Serial Number: 68664772

## 2023-05-16 MED ORDER — METOPROLOL TARTRATE 50 MG PO TABS: 50.0000 mg | ORAL_TABLET | Freq: Two times a day (BID) | ORAL | 3 refills | Status: AC

## 2023-05-16 NOTE — Patient Instructions (Addendum)
Medication Instructions:  Your physician recommends that you continue on your current medications as directed. Please refer to the Current Medication list given to you today.  *If you need a refill on your cardiac medications before your next appointment, please call your pharmacy*  Lab Work: None ordered.  If you have labs (blood work) drawn today and your tests are completely normal, you will receive your results only by: MyChart Message (if you have MyChart) OR A paper copy in the mail If you have any lab test that is abnormal or we need to change your treatment, we will call you to review the results.  Testing/Procedures: None ordered.  Follow-Up: At [facility], you and your health needs are our priority.  As part of our continuing mission to provide you with exceptional heart care, we have created designated Provider Care Teams.  These Care Teams include your primary Cardiologist (physician) and Advanced Practice Providers (APPs -  Physician Assistants and Nurse Practitioners) who all work together to provide you with the care you need, when you need it.  Your next appointment:   1 year(s)  The format for your next appointment:   In Person  Provider:   Earnest Rosier, NP or Dr Elberta Fortis  Remote monitoring is used to monitor your Pacemaker/ ICD from home. This monitoring reduces the number of office visits required to check your device to one time per year. It allows [place] to keep an eye on the functioning of your device to ensure it is working properly.   Important Information About Sugar

## 2023-05-28 ENCOUNTER — Telehealth: Payer: Self-pay

## 2023-05-28 NOTE — Telephone Encounter (Signed)
Patient states she was taking trash out about that time. I made her aware this is perfectly normal. She has been doing well otherwise, no concerns.   Appt made for routine yearly f/u with Dr. Elberta Fortis on 07/03/2022.  Patient verbalizes understanding.

## 2023-05-28 NOTE — Telephone Encounter (Signed)
BIO dual chamber PPM device alert for atrial event. 36 secs of 1:1 Atrial driven tachycardia with Vrates average 140's.  Overall HR's have been fairly well controlled with some recent uptick in PVC's.   Patient is past due for annual follow up with Dr. Elberta Fortis.  Unable to reach patient again despite multiple attempts for 2 months.  I have reached out to her son (listed as emergency contact) to ask her to call us back.

## 2023-05-29 ENCOUNTER — Telehealth: Payer: Self-pay | Admitting: Cardiology

## 2023-05-29 NOTE — Telephone Encounter (Signed)
Patient stated she was returning RN's call and that she is doing OK now.

## 2023-05-30 NOTE — Progress Notes (Signed)
Remote pacemaker transmission.   

## 2023-07-04 ENCOUNTER — Ambulatory Visit: Payer: Medicare Other | Attending: Cardiology | Admitting: Cardiology

## 2023-07-04 ENCOUNTER — Encounter: Payer: Self-pay | Admitting: Cardiology

## 2023-07-04 VITALS — BP 126/70 | HR 80 | Ht 67.0 in | Wt 130.2 lb

## 2023-07-04 DIAGNOSIS — I10 Essential (primary) hypertension: Secondary | ICD-10-CM

## 2023-07-04 DIAGNOSIS — I471 Supraventricular tachycardia, unspecified: Secondary | ICD-10-CM

## 2023-07-04 DIAGNOSIS — I442 Atrioventricular block, complete: Secondary | ICD-10-CM

## 2023-07-04 NOTE — Progress Notes (Signed)
  Electrophysiology Office Note:   Date:  07/04/2023  ID:  Autumn Benjamin, DOB 08-27-1940, MRN [id number]  Primary Cardiologist: None Primary Heart Failure: None Electrophysiologist: Autumn Kingsbury [name], MD      History of Present Illness:   Autumn Benjamin is a 83 y.o. female with h/o syncope with complete heart block, long QT, hypertension seen today for routine electrophysiology followup.   Since last being seen in our clinic the patient reports doing overall well.  She is currently feeling well without acute complaints.  Approximately 5 weeks ago she had a fall.  She states that her left leg became severely weak when she stood up.  She did not have much dizziness.  She did not lose consciousness.  She states that her leg was weak for 15 to 30 minutes.  Aside from that, she has no acute complaints.  she denies chest pain, palpitations, dyspnea, PND, orthopnea, nausea, vomiting, dizziness, syncope, edema, weight gain, or early satiety.   Review of systems complete and found to be negative unless listed in HPI.      EP Information / Studies Reviewed:    EKG is not ordered today. EKG from 05/16/23 reviewed which showed atrial paced      PPM Interrogation-  reviewed in detail today,  See PACEART report.  Device History: Biotronik Dual Chamber PPM implanted 05/18/2015 for CHB  Risk Assessment/Calculations:             Physical Exam:   VS:  There were no vitals taken for this visit.   Wt Readings from Last 3 Encounters:  05/16/23 132 lb 6.4 oz (60.1 kg)  12/03/22 135 lb (61.2 kg)  05/02/22 135 lb 12.8 oz (61.6 kg)     GEN: Well nourished, well developed in no acute distress NECK: No JVD; No carotid bruits CARDIAC: Regular rate and rhythm, no murmurs, rubs, gallops RESPIRATORY:  Clear to auscultation without rales, wheezing or rhonchi  ABDOMEN: Soft, non-tender, non-distended EXTREMITIES:  No edema; No deformity   ASSESSMENT AND PLAN:    CHB s/p Biotronik PPM  Normal  PPM function See Pace Art report No changes today  2.  SVT: No episodes  3.  Hypertension: Currently well-controlled  4.  Falls: Concerning for TIA as she had weakness in her leg.  I have told her that she has similar symptoms, Autumn Benjamin need potential CT head.  Disposition:   Follow up with EP APP in 12 months  Signed, Autumn Burich [name], MD

## 2023-07-04 NOTE — Patient Instructions (Signed)
 Medication Instructions:  Your physician recommends that you continue on your current medications as directed. Please refer to the Current Medication list given to you today.  *If you need a refill on your cardiac medications before your next appointment, please call your pharmacy*   Lab Work: None ordered   Testing/Procedures: None ordered   Follow-Up: At [facility], you and your health needs are our priority.  As part of our continuing mission to provide you with exceptional heart care, we have created designated Provider Care Teams.  These Care Teams include your primary Cardiologist (physician) and Advanced Practice Providers (APPs -  Physician Assistants and Nurse Practitioners) who all work together to provide you with the care you need, when you need it.  Your next appointment:   1 month(s)  The format for your next appointment:   In Person  Provider:   You will see one of the following Advanced Practice Providers on your designated Care Team:   Charlies Arthur, PA-C Michael Andy Tillery, PA-C Suzann Riddle, NP Daphne Barrack, NP    Thank you for choosing [facility]!!   Maeola Domino, RN [phone]

## 2023-07-05 LAB — CUP PACEART INCLINIC DEVICE CHECK
Date Time Interrogation Session: 20250109073004
Implantable Lead Connection Status: 753985
Implantable Lead Connection Status: 753985
Implantable Lead Implant Date: 20161123
Implantable Lead Implant Date: 20161123
Implantable Lead Location: 753859
Implantable Lead Location: 753860
Implantable Lead Model: 5076
Implantable Lead Model: 5076
Implantable Pulse Generator Implant Date: 20161123
Pulse Gen Model: 394969
Pulse Gen Serial Number: 68664772

## 2023-08-07 ENCOUNTER — Ambulatory Visit (INDEPENDENT_AMBULATORY_CARE_PROVIDER_SITE_OTHER): Payer: Medicare Other

## 2023-08-07 DIAGNOSIS — I442 Atrioventricular block, complete: Secondary | ICD-10-CM

## 2023-08-08 LAB — CUP PACEART REMOTE DEVICE CHECK
Date Time Interrogation Session: 20250212095202
Implantable Lead Connection Status: 753985
Implantable Lead Connection Status: 753985
Implantable Lead Implant Date: 20161123
Implantable Lead Implant Date: 20161123
Implantable Lead Location: 753859
Implantable Lead Location: 753860
Implantable Lead Model: 5076
Implantable Lead Model: 5076
Implantable Pulse Generator Implant Date: 20161123
Pulse Gen Model: 394969
Pulse Gen Serial Number: 68664772

## 2023-09-12 NOTE — Addendum Note (Signed)
 Addended by: Elease Etienne A on: 09/12/2023 09:18 AM   Modules accepted: Orders

## 2023-09-12 NOTE — Progress Notes (Signed)
 Remote pacemaker transmission.

## 2023-11-06 ENCOUNTER — Ambulatory Visit (INDEPENDENT_AMBULATORY_CARE_PROVIDER_SITE_OTHER): Payer: Medicare Other

## 2023-11-06 DIAGNOSIS — I442 Atrioventricular block, complete: Secondary | ICD-10-CM | POA: Diagnosis not present

## 2023-11-07 LAB — CUP PACEART REMOTE DEVICE CHECK
Date Time Interrogation Session: 20250514080117
Implantable Lead Connection Status: 753985
Implantable Lead Connection Status: 753985
Implantable Lead Implant Date: 20161123
Implantable Lead Implant Date: 20161123
Implantable Lead Location: 753859
Implantable Lead Location: 753860
Implantable Lead Model: 5076
Implantable Lead Model: 5076
Implantable Pulse Generator Implant Date: 20161123
Pulse Gen Model: 394969
Pulse Gen Serial Number: 68664772

## 2023-11-10 ENCOUNTER — Ambulatory Visit: Payer: Self-pay | Admitting: Cardiology

## 2023-12-17 NOTE — Addendum Note (Signed)
 Addended by: VICCI SELLER A on: 12/17/2023 03:34 PM   Modules accepted: Orders

## 2023-12-17 NOTE — Progress Notes (Signed)
 Remote pacemaker transmission.

## 2024-02-05 ENCOUNTER — Ambulatory Visit: Payer: Medicare Other

## 2024-02-05 DIAGNOSIS — I442 Atrioventricular block, complete: Secondary | ICD-10-CM | POA: Diagnosis not present

## 2024-02-06 LAB — CUP PACEART REMOTE DEVICE CHECK
Date Time Interrogation Session: 20250813085124
Implantable Lead Connection Status: 753985
Implantable Lead Connection Status: 753985
Implantable Lead Implant Date: 20161123
Implantable Lead Implant Date: 20161123
Implantable Lead Location: 753859
Implantable Lead Location: 753860
Implantable Lead Model: 5076
Implantable Lead Model: 5076
Implantable Pulse Generator Implant Date: 20161123
Pulse Gen Model: 394969
Pulse Gen Serial Number: 68664772

## 2024-02-08 ENCOUNTER — Ambulatory Visit: Payer: Self-pay | Admitting: Cardiology

## 2024-03-19 NOTE — Progress Notes (Signed)
 Remote PPM Transmission

## 2024-04-17 ENCOUNTER — Other Ambulatory Visit: Payer: Self-pay | Admitting: Pulmonary Disease

## 2024-05-06 ENCOUNTER — Ambulatory Visit (INDEPENDENT_AMBULATORY_CARE_PROVIDER_SITE_OTHER): Payer: Medicare Other

## 2024-05-06 DIAGNOSIS — I471 Supraventricular tachycardia, unspecified: Secondary | ICD-10-CM

## 2024-05-07 LAB — CUP PACEART REMOTE DEVICE CHECK
Battery Voltage: 40
Date Time Interrogation Session: 20251112082610
Implantable Lead Connection Status: 753985
Implantable Lead Connection Status: 753985
Implantable Lead Implant Date: 20161123
Implantable Lead Implant Date: 20161123
Implantable Lead Location: 753859
Implantable Lead Location: 753860
Implantable Lead Model: 5076
Implantable Lead Model: 5076
Implantable Pulse Generator Implant Date: 20161123
Pulse Gen Model: 394969
Pulse Gen Serial Number: 68664772

## 2024-05-11 NOTE — Progress Notes (Signed)
 Remote PPM Transmission

## 2024-05-12 ENCOUNTER — Ambulatory Visit: Payer: Self-pay | Admitting: Cardiology

## 2024-08-05 ENCOUNTER — Ambulatory Visit

## 2024-11-04 ENCOUNTER — Ambulatory Visit

## 2025-02-03 ENCOUNTER — Ambulatory Visit

## 2025-05-05 ENCOUNTER — Ambulatory Visit

## 2025-08-04 ENCOUNTER — Ambulatory Visit
